# Patient Record
Sex: Female | Born: 1970 | Race: White | Hispanic: No | Marital: Married | Smoking: Never smoker
Health system: Southern US, Community
[De-identification: ages and names within clinical notes are randomized; demographics above are authoritative.]

## PROBLEM LIST (undated history)

## (undated) DIAGNOSIS — R112 Nausea with vomiting, unspecified: Secondary | ICD-10-CM

## (undated) DIAGNOSIS — J45909 Unspecified asthma, uncomplicated: Secondary | ICD-10-CM

## (undated) DIAGNOSIS — M1990 Unspecified osteoarthritis, unspecified site: Secondary | ICD-10-CM

## (undated) DIAGNOSIS — Z9889 Other specified postprocedural states: Secondary | ICD-10-CM

## (undated) DIAGNOSIS — G8929 Other chronic pain: Secondary | ICD-10-CM

## (undated) HISTORY — PX: APPENDECTOMY: SHX54

---
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
## 1998-07-27 ENCOUNTER — Inpatient Hospital Stay (HOSPITAL_COMMUNITY): Admission: AD | Admit: 1998-07-27 | Discharge: 1998-07-30 | Payer: Self-pay | Admitting: Obstetrics and Gynecology

## 1998-07-29 ENCOUNTER — Encounter: Payer: Self-pay | Admitting: Obstetrics and Gynecology

## 1999-08-18 ENCOUNTER — Other Ambulatory Visit: Admission: RE | Admit: 1999-08-18 | Discharge: 1999-08-18 | Payer: Self-pay | Admitting: Obstetrics and Gynecology

## 2001-01-23 ENCOUNTER — Other Ambulatory Visit: Admission: RE | Admit: 2001-01-23 | Discharge: 2001-01-23 | Payer: Self-pay | Admitting: Obstetrics and Gynecology

## 2001-09-29 ENCOUNTER — Ambulatory Visit (HOSPITAL_COMMUNITY): Admission: RE | Admit: 2001-09-29 | Discharge: 2001-09-29 | Payer: Self-pay | Admitting: Obstetrics & Gynecology

## 2001-09-29 ENCOUNTER — Encounter: Payer: Self-pay | Admitting: Obstetrics & Gynecology

## 2001-10-09 ENCOUNTER — Inpatient Hospital Stay (HOSPITAL_COMMUNITY): Admission: AD | Admit: 2001-10-09 | Discharge: 2001-10-12 | Payer: Self-pay | Admitting: Obstetrics & Gynecology

## 2002-07-02 ENCOUNTER — Encounter: Payer: Self-pay | Admitting: Obstetrics and Gynecology

## 2002-07-02 ENCOUNTER — Ambulatory Visit (HOSPITAL_COMMUNITY): Admission: RE | Admit: 2002-07-02 | Discharge: 2002-07-02 | Payer: Self-pay | Admitting: Obstetrics and Gynecology

## 2003-03-04 ENCOUNTER — Other Ambulatory Visit: Admission: RE | Admit: 2003-03-04 | Discharge: 2003-03-04 | Payer: Self-pay | Admitting: Obstetrics & Gynecology

## 2003-09-15 ENCOUNTER — Ambulatory Visit (HOSPITAL_COMMUNITY): Admission: RE | Admit: 2003-09-15 | Discharge: 2003-09-15 | Payer: Self-pay | Admitting: Obstetrics and Gynecology

## 2005-04-18 ENCOUNTER — Other Ambulatory Visit: Admission: RE | Admit: 2005-04-18 | Discharge: 2005-04-18 | Payer: Self-pay | Admitting: Obstetrics & Gynecology

## 2005-11-20 ENCOUNTER — Ambulatory Visit (HOSPITAL_COMMUNITY): Admission: RE | Admit: 2005-11-20 | Discharge: 2005-11-20 | Payer: Self-pay | Admitting: Obstetrics & Gynecology

## 2010-04-24 ENCOUNTER — Encounter: Admission: RE | Admit: 2010-04-24 | Discharge: 2010-04-24 | Payer: Self-pay | Admitting: Obstetrics & Gynecology

## 2010-10-27 NOTE — Discharge Summary (Signed)
[facility]  Patient:    Alyssa Hester, Alyssa Hester Visit Number: [id number] MRN: [id number]          Service Type: OBS Location: 910A 9130 01 Attending Physician:  Mickle Mallory Dictated by:   Leilani Able, P.A. Admit Date:  10/09/2001 Discharge Date: 10/12/2001                             Discharge Summary  FINAL DIAGNOSES:              1. Intrauterine pregnancy at 38+ weeks                                  gestation.                               2. Active macrosomia.  PROCEDURE:                    Primary low transverse cesarean section.  SURGEON:                      Gerrit Friends. Aldona Bar, M.D.  COMPLICATIONS:                None.  HOSPITAL COURSE:              This 40 year old, G3, P1 is being taken to the operating room on Oct 09, 2001 for suspected macrosomia.  The patient had an ultrasound performed at the [facility] two days ago which was consistent with a baby of at least 9 pounds.  This is her second delivery. Her first was born around 7 pounds.  At this point, the patients cervix was closed and vertex was totally unengaged.  Estimated fetal weight is between 9 and 10 pounds.  The patient was taken to the operating room by Dr. Annamaria Helling, where a primary low transverse cesarean section was performed with the delivery of a 9 pound 12 ounce female infant with Apgars of 8 and 9.  Delivery went without complication.  The patients postoperative course was benign without significant fevers.  The patient had some postoperative anemia and was started on iron in the hospital.  She was felt ready for discharge on postoperative day three.  The patient was sent home on a regular diet and told to decrease activities.  She was told to continue prenatal vitamins and FESO4.  She was given a prescription for Percocet and ibuprofen.  She was told to follow up in the office in four weeks.  The patient was also to call with any increased pain or  bleeding. Dictated by:   Leilani Able, P.A. Attending Physician:  Mickle Mallory DD:  11/10/01 TD:  11/12/01 Job: [id number]

## 2010-10-27 NOTE — Op Note (Signed)
[facility]  Patient:    Alyssa Hester, Alyssa Hester Visit Number: [id number] MRN: [id number]          Service Type: OBS Location: [facility] Attending Physician:  Mickle Mallory Dictated by:   Gerrit Friends. Aldona Bar, M.D. Proc. Date: 10/09/01                             Operative Report  PREOPERATIVE DIAGNOSES:       A 38+ week intrauterine pregnancy, suspected macrosomia.  POSTOPERATIVE DIAGNOSES:      A 38+ week intrauterine pregnancy, suspected macrosomia, delivery of 9 pound 12 ounce female infant, Apgars 8/9.  PROCEDURE:                    Primary low transverse cesarean section.  SURGEON:                      Gerrit Friends. Aldona Bar, M.D.  ANESTHESIA:                   Spinal.  HISTORY:                      This 40 year old gravida 3, para 1 is being taken to the operating room with a diagnosis of suspected macrosomia having had an ultrasound at [facility] 10 days ago which was consistent with a baby that was at least 9 pounds.  She had her first delivery several years ago and delivered a 7 pound baby.  At the time of admission with this pregnancy her cervix was closed.  Vertex was totally unengaged.  Estimated fetal weight was at least 9-10 pounds.  She is taken to the operating room now for primary low transverse cesarean section with a diagnosis of suspected macrosomia.  PROCEDURE:                    Patient was taken to the operating room where a spinal anesthetic was carried out without difficulty and thereafter she was prepped and draped with a Foley catheter placed in the usual fashion for cesarean section.  At this time after she was adequately draped and good anesthetic levels were documented, procedure was begun.  A Pfannenstiel incision was made and with minimal difficulty dissected down sharply to and through the fascia in a low transverse fashion.  Hemostasis was created at each layer.  Subfascial space was created inferiorly and superiorly.   Muscles separated in the midline.  Peritoneum identified and entered appropriately with care taken to avoid the bowel superiorly and the bladder inferiorly.  At this time the vesicouterine peritoneum was incised in a low transverse fashion and pushed off the lower uterine segment with ease.  Sharp incision into the uterus in a low transverse fashion was then carried out with Metzenbaum scissors and extended with the fingers.  Amniotomy was produced with production of clear fluid and thereafter with the aid of the vacuum extractor delivery of a viable female infant which cried spotaneously at once was carried out.  There was some difficulty with delivery of the shoulders.  At this time after the cord was clamped and cut the baby was passed off to the awaiting team.  Subsequent Apgars were assigned at 8 and 9 and subsequent weight was found to be 9 pounds 12 ounces.  After the cord bloods were collected the placenta was delivered intact.  The uterus  was then exteriorized, manually inspected, and rendered free of any remaining products of conception.  Closure of the uterus was then carried out using a single layer of #1 Vicryl in a running locked fashion and this was reinforced with several figure-of-eight #1 Vicryl sutures for additional hemostasis.  At this time with good hemostasis noted and with tubes and ovaries noted to be normal, the uterus contracted well and no foreign bodies to be known to be remaining in the abdominal cavity, the uterus was replaced in the abdomen and the abdomen lavaged of all free blood and clot.  Closure of the abdomen was carried out at this time in layers.  The abdominoperitoneum was closed with 0 Vicryl in a running fashion and the muscles assured with same.  Assured of good subfascial hemostasis, the fascia was then reapproximated with 0 Vicryl ______ to the midline bilaterally.  Subcutaneous tissue was then rendered hemostatic and staples were then used to  close the skin.  Sterile pressure dressing was then applied.  The patient at this time was transported to the recovery room in satisfactory condition having tolerated procedure well.  Estimated blood loss 500 cc.  All counts correct x2.  On arrival in the recovery room mother was stable and baby was likewise doing well in the nursery.  In summary, this patient presented at near term with a baby that was strongly suspected to be in excess of 9 pounds and because of the patients small frame and the fact that the vertex was floating and an ultrasound was consistent with a baby that was at least 9 pounds 10 days ago, decision was made to proceed with primary low transverse cesarean section because of suspected macrosomia.  A baby weighing 9 pounds 12 ounces was delivered with good Apgars.  At the conclusion of the procedure both mother and baby were doing well in their respective recovery areas. Dictated by:   Gerrit Friends. Aldona Bar, M.D. Attending Physician:  Mickle Mallory DD:  10/09/01 TD:  10/10/01 Job: [id number]

## 2010-12-22 ENCOUNTER — Ambulatory Visit (HOSPITAL_COMMUNITY)
Admission: RE | Admit: 2010-12-22 | Discharge: 2010-12-22 | Disposition: A | Discharge status: Home / Self Care | Payer: PRIVATE HEALTH INSURANCE | Source: Ambulatory Visit | Attending: Obstetrics and Gynecology | Admitting: Obstetrics and Gynecology

## 2010-12-22 ENCOUNTER — Other Ambulatory Visit (HOSPITAL_COMMUNITY): Payer: Self-pay | Admitting: Obstetrics and Gynecology

## 2010-12-22 DIAGNOSIS — Z111 Encounter for screening for respiratory tuberculosis: Secondary | ICD-10-CM

## 2011-09-05 ENCOUNTER — Other Ambulatory Visit: Payer: Self-pay | Admitting: Obstetrics & Gynecology

## 2011-09-05 DIAGNOSIS — Z1231 Encounter for screening mammogram for malignant neoplasm of breast: Secondary | ICD-10-CM

## 2011-09-19 ENCOUNTER — Ambulatory Visit
Admission: RE | Admit: 2011-09-19 | Discharge: 2011-09-19 | Disposition: A | Discharge status: Home / Self Care | Payer: PRIVATE HEALTH INSURANCE | Source: Ambulatory Visit | Attending: Obstetrics & Gynecology | Admitting: Obstetrics & Gynecology

## 2011-09-19 DIAGNOSIS — Z1231 Encounter for screening mammogram for malignant neoplasm of breast: Secondary | ICD-10-CM

## 2014-03-15 ENCOUNTER — Other Ambulatory Visit: Payer: Self-pay

## 2014-03-15 DIAGNOSIS — Z1231 Encounter for screening mammogram for malignant neoplasm of breast: Secondary | ICD-10-CM

## 2014-03-24 ENCOUNTER — Encounter (INDEPENDENT_AMBULATORY_CARE_PROVIDER_SITE_OTHER): Payer: Self-pay

## 2014-03-24 ENCOUNTER — Ambulatory Visit
Admission: RE | Admit: 2014-03-24 | Discharge: 2014-03-24 | Disposition: A | Discharge status: Home / Self Care | Payer: BC Managed Care – PPO | Source: Ambulatory Visit

## 2014-03-24 DIAGNOSIS — Z1231 Encounter for screening mammogram for malignant neoplasm of breast: Secondary | ICD-10-CM

## 2015-08-05 ENCOUNTER — Other Ambulatory Visit: Payer: Self-pay

## 2015-08-05 DIAGNOSIS — Z1231 Encounter for screening mammogram for malignant neoplasm of breast: Secondary | ICD-10-CM

## 2015-08-17 ENCOUNTER — Ambulatory Visit
Admission: RE | Admit: 2015-08-17 | Discharge: 2015-08-17 | Disposition: A | Discharge status: Home / Self Care | Payer: Managed Care, Other (non HMO) | Source: Ambulatory Visit

## 2015-08-17 DIAGNOSIS — Z1231 Encounter for screening mammogram for malignant neoplasm of breast: Secondary | ICD-10-CM

## 2018-01-31 ENCOUNTER — Other Ambulatory Visit: Payer: Self-pay | Admitting: Obstetrics & Gynecology

## 2018-01-31 DIAGNOSIS — Z1231 Encounter for screening mammogram for malignant neoplasm of breast: Secondary | ICD-10-CM

## 2018-02-26 ENCOUNTER — Ambulatory Visit
Admission: RE | Admit: 2018-02-26 | Discharge: 2018-02-26 | Disposition: A | Discharge status: Home / Self Care | Payer: Managed Care, Other (non HMO) | Source: Ambulatory Visit | Attending: Obstetrics & Gynecology | Admitting: Obstetrics & Gynecology

## 2018-02-26 DIAGNOSIS — Z1231 Encounter for screening mammogram for malignant neoplasm of breast: Secondary | ICD-10-CM

## 2019-02-27 ENCOUNTER — Other Ambulatory Visit: Payer: Self-pay | Admitting: *Deleted

## 2019-02-27 DIAGNOSIS — Z20822 Contact with and (suspected) exposure to covid-19: Secondary | ICD-10-CM

## 2019-02-28 LAB — NOVEL CORONAVIRUS, NAA: SARS-CoV-2, NAA: DETECTED — AB

## 2019-07-12 DIAGNOSIS — W109XXA Fall (on) (from) unspecified stairs and steps, initial encounter: Secondary | ICD-10-CM | POA: Diagnosis not present

## 2019-07-12 DIAGNOSIS — Z882 Allergy status to sulfonamides status: Secondary | ICD-10-CM | POA: Diagnosis not present

## 2019-07-12 DIAGNOSIS — J45909 Unspecified asthma, uncomplicated: Secondary | ICD-10-CM | POA: Diagnosis not present

## 2019-07-12 DIAGNOSIS — S20221A Contusion of right back wall of thorax, initial encounter: Secondary | ICD-10-CM | POA: Diagnosis not present

## 2019-07-12 DIAGNOSIS — S20229A Contusion of unspecified back wall of thorax, initial encounter: Secondary | ICD-10-CM | POA: Diagnosis not present

## 2019-07-12 DIAGNOSIS — S299XXA Unspecified injury of thorax, initial encounter: Secondary | ICD-10-CM | POA: Diagnosis not present

## 2019-07-12 DIAGNOSIS — M546 Pain in thoracic spine: Secondary | ICD-10-CM | POA: Diagnosis not present

## 2019-07-12 DIAGNOSIS — Z6833 Body mass index (BMI) 33.0-33.9, adult: Secondary | ICD-10-CM | POA: Diagnosis not present

## 2019-10-16 ENCOUNTER — Other Ambulatory Visit: Payer: Self-pay | Admitting: Obstetrics & Gynecology

## 2019-10-16 DIAGNOSIS — Z1231 Encounter for screening mammogram for malignant neoplasm of breast: Secondary | ICD-10-CM

## 2019-10-20 ENCOUNTER — Ambulatory Visit
Admission: RE | Admit: 2019-10-20 | Discharge: 2019-10-20 | Disposition: A | Discharge status: Home / Self Care | Payer: BC Managed Care – PPO | Source: Ambulatory Visit | Attending: Obstetrics & Gynecology | Admitting: Obstetrics & Gynecology

## 2019-10-20 ENCOUNTER — Other Ambulatory Visit: Payer: Self-pay

## 2019-10-20 DIAGNOSIS — Z1231 Encounter for screening mammogram for malignant neoplasm of breast: Secondary | ICD-10-CM

## 2019-10-21 ENCOUNTER — Other Ambulatory Visit: Payer: Self-pay | Admitting: Obstetrics & Gynecology

## 2019-10-21 DIAGNOSIS — R928 Other abnormal and inconclusive findings on diagnostic imaging of breast: Secondary | ICD-10-CM

## 2019-10-27 ENCOUNTER — Other Ambulatory Visit: Payer: Self-pay

## 2019-10-27 ENCOUNTER — Ambulatory Visit
Admission: RE | Admit: 2019-10-27 | Discharge: 2019-10-27 | Disposition: A | Discharge status: Home / Self Care | Payer: BC Managed Care – PPO | Source: Ambulatory Visit | Attending: Obstetrics & Gynecology | Admitting: Obstetrics & Gynecology

## 2019-10-27 DIAGNOSIS — R928 Other abnormal and inconclusive findings on diagnostic imaging of breast: Secondary | ICD-10-CM

## 2019-11-17 DIAGNOSIS — M9903 Segmental and somatic dysfunction of lumbar region: Secondary | ICD-10-CM | POA: Diagnosis not present

## 2019-11-17 DIAGNOSIS — M9902 Segmental and somatic dysfunction of thoracic region: Secondary | ICD-10-CM | POA: Diagnosis not present

## 2019-11-17 DIAGNOSIS — M5386 Other specified dorsopathies, lumbar region: Secondary | ICD-10-CM | POA: Diagnosis not present

## 2019-11-17 DIAGNOSIS — M9905 Segmental and somatic dysfunction of pelvic region: Secondary | ICD-10-CM | POA: Diagnosis not present

## 2019-11-18 DIAGNOSIS — M9903 Segmental and somatic dysfunction of lumbar region: Secondary | ICD-10-CM | POA: Diagnosis not present

## 2019-11-18 DIAGNOSIS — M9902 Segmental and somatic dysfunction of thoracic region: Secondary | ICD-10-CM | POA: Diagnosis not present

## 2019-11-18 DIAGNOSIS — M9905 Segmental and somatic dysfunction of pelvic region: Secondary | ICD-10-CM | POA: Diagnosis not present

## 2019-11-18 DIAGNOSIS — M5386 Other specified dorsopathies, lumbar region: Secondary | ICD-10-CM | POA: Diagnosis not present

## 2019-11-24 DIAGNOSIS — M9903 Segmental and somatic dysfunction of lumbar region: Secondary | ICD-10-CM | POA: Diagnosis not present

## 2019-11-24 DIAGNOSIS — M9902 Segmental and somatic dysfunction of thoracic region: Secondary | ICD-10-CM | POA: Diagnosis not present

## 2019-11-24 DIAGNOSIS — M9905 Segmental and somatic dysfunction of pelvic region: Secondary | ICD-10-CM | POA: Diagnosis not present

## 2019-11-24 DIAGNOSIS — M5386 Other specified dorsopathies, lumbar region: Secondary | ICD-10-CM | POA: Diagnosis not present

## 2019-11-25 DIAGNOSIS — M5386 Other specified dorsopathies, lumbar region: Secondary | ICD-10-CM | POA: Diagnosis not present

## 2019-11-25 DIAGNOSIS — M9903 Segmental and somatic dysfunction of lumbar region: Secondary | ICD-10-CM | POA: Diagnosis not present

## 2019-11-25 DIAGNOSIS — M9902 Segmental and somatic dysfunction of thoracic region: Secondary | ICD-10-CM | POA: Diagnosis not present

## 2019-11-25 DIAGNOSIS — M9905 Segmental and somatic dysfunction of pelvic region: Secondary | ICD-10-CM | POA: Diagnosis not present

## 2019-12-01 DIAGNOSIS — M9905 Segmental and somatic dysfunction of pelvic region: Secondary | ICD-10-CM | POA: Diagnosis not present

## 2019-12-01 DIAGNOSIS — M9902 Segmental and somatic dysfunction of thoracic region: Secondary | ICD-10-CM | POA: Diagnosis not present

## 2019-12-01 DIAGNOSIS — M5386 Other specified dorsopathies, lumbar region: Secondary | ICD-10-CM | POA: Diagnosis not present

## 2019-12-01 DIAGNOSIS — M9903 Segmental and somatic dysfunction of lumbar region: Secondary | ICD-10-CM | POA: Diagnosis not present

## 2019-12-02 DIAGNOSIS — M9905 Segmental and somatic dysfunction of pelvic region: Secondary | ICD-10-CM | POA: Diagnosis not present

## 2019-12-02 DIAGNOSIS — M5386 Other specified dorsopathies, lumbar region: Secondary | ICD-10-CM | POA: Diagnosis not present

## 2019-12-02 DIAGNOSIS — M9903 Segmental and somatic dysfunction of lumbar region: Secondary | ICD-10-CM | POA: Diagnosis not present

## 2019-12-02 DIAGNOSIS — M9902 Segmental and somatic dysfunction of thoracic region: Secondary | ICD-10-CM | POA: Diagnosis not present

## 2019-12-08 DIAGNOSIS — M5386 Other specified dorsopathies, lumbar region: Secondary | ICD-10-CM | POA: Diagnosis not present

## 2019-12-08 DIAGNOSIS — M9903 Segmental and somatic dysfunction of lumbar region: Secondary | ICD-10-CM | POA: Diagnosis not present

## 2019-12-08 DIAGNOSIS — M9905 Segmental and somatic dysfunction of pelvic region: Secondary | ICD-10-CM | POA: Diagnosis not present

## 2019-12-08 DIAGNOSIS — M9902 Segmental and somatic dysfunction of thoracic region: Secondary | ICD-10-CM | POA: Diagnosis not present

## 2019-12-09 DIAGNOSIS — M9903 Segmental and somatic dysfunction of lumbar region: Secondary | ICD-10-CM | POA: Diagnosis not present

## 2019-12-09 DIAGNOSIS — M9902 Segmental and somatic dysfunction of thoracic region: Secondary | ICD-10-CM | POA: Diagnosis not present

## 2019-12-09 DIAGNOSIS — M5386 Other specified dorsopathies, lumbar region: Secondary | ICD-10-CM | POA: Diagnosis not present

## 2019-12-09 DIAGNOSIS — M9905 Segmental and somatic dysfunction of pelvic region: Secondary | ICD-10-CM | POA: Diagnosis not present

## 2019-12-15 DIAGNOSIS — M5386 Other specified dorsopathies, lumbar region: Secondary | ICD-10-CM | POA: Diagnosis not present

## 2019-12-15 DIAGNOSIS — M9903 Segmental and somatic dysfunction of lumbar region: Secondary | ICD-10-CM | POA: Diagnosis not present

## 2019-12-15 DIAGNOSIS — M9902 Segmental and somatic dysfunction of thoracic region: Secondary | ICD-10-CM | POA: Diagnosis not present

## 2019-12-15 DIAGNOSIS — M9905 Segmental and somatic dysfunction of pelvic region: Secondary | ICD-10-CM | POA: Diagnosis not present

## 2019-12-16 DIAGNOSIS — M9903 Segmental and somatic dysfunction of lumbar region: Secondary | ICD-10-CM | POA: Diagnosis not present

## 2019-12-16 DIAGNOSIS — M5386 Other specified dorsopathies, lumbar region: Secondary | ICD-10-CM | POA: Diagnosis not present

## 2019-12-16 DIAGNOSIS — M9902 Segmental and somatic dysfunction of thoracic region: Secondary | ICD-10-CM | POA: Diagnosis not present

## 2019-12-16 DIAGNOSIS — M9905 Segmental and somatic dysfunction of pelvic region: Secondary | ICD-10-CM | POA: Diagnosis not present

## 2019-12-29 DIAGNOSIS — M5386 Other specified dorsopathies, lumbar region: Secondary | ICD-10-CM | POA: Diagnosis not present

## 2019-12-29 DIAGNOSIS — M9903 Segmental and somatic dysfunction of lumbar region: Secondary | ICD-10-CM | POA: Diagnosis not present

## 2019-12-29 DIAGNOSIS — M9905 Segmental and somatic dysfunction of pelvic region: Secondary | ICD-10-CM | POA: Diagnosis not present

## 2019-12-29 DIAGNOSIS — M9902 Segmental and somatic dysfunction of thoracic region: Secondary | ICD-10-CM | POA: Diagnosis not present

## 2020-01-05 DIAGNOSIS — M9903 Segmental and somatic dysfunction of lumbar region: Secondary | ICD-10-CM | POA: Diagnosis not present

## 2020-01-05 DIAGNOSIS — M9902 Segmental and somatic dysfunction of thoracic region: Secondary | ICD-10-CM | POA: Diagnosis not present

## 2020-01-05 DIAGNOSIS — M5386 Other specified dorsopathies, lumbar region: Secondary | ICD-10-CM | POA: Diagnosis not present

## 2020-01-05 DIAGNOSIS — M9905 Segmental and somatic dysfunction of pelvic region: Secondary | ICD-10-CM | POA: Diagnosis not present

## 2020-01-12 DIAGNOSIS — M9905 Segmental and somatic dysfunction of pelvic region: Secondary | ICD-10-CM | POA: Diagnosis not present

## 2020-01-12 DIAGNOSIS — M5386 Other specified dorsopathies, lumbar region: Secondary | ICD-10-CM | POA: Diagnosis not present

## 2020-01-12 DIAGNOSIS — M9903 Segmental and somatic dysfunction of lumbar region: Secondary | ICD-10-CM | POA: Diagnosis not present

## 2020-01-12 DIAGNOSIS — M9902 Segmental and somatic dysfunction of thoracic region: Secondary | ICD-10-CM | POA: Diagnosis not present

## 2020-01-19 DIAGNOSIS — M9903 Segmental and somatic dysfunction of lumbar region: Secondary | ICD-10-CM | POA: Diagnosis not present

## 2020-01-19 DIAGNOSIS — M9902 Segmental and somatic dysfunction of thoracic region: Secondary | ICD-10-CM | POA: Diagnosis not present

## 2020-01-19 DIAGNOSIS — M5386 Other specified dorsopathies, lumbar region: Secondary | ICD-10-CM | POA: Diagnosis not present

## 2020-01-19 DIAGNOSIS — M9905 Segmental and somatic dysfunction of pelvic region: Secondary | ICD-10-CM | POA: Diagnosis not present

## 2020-01-27 DIAGNOSIS — M9903 Segmental and somatic dysfunction of lumbar region: Secondary | ICD-10-CM | POA: Diagnosis not present

## 2020-01-27 DIAGNOSIS — M5386 Other specified dorsopathies, lumbar region: Secondary | ICD-10-CM | POA: Diagnosis not present

## 2020-01-27 DIAGNOSIS — M9905 Segmental and somatic dysfunction of pelvic region: Secondary | ICD-10-CM | POA: Diagnosis not present

## 2020-01-27 DIAGNOSIS — M9902 Segmental and somatic dysfunction of thoracic region: Secondary | ICD-10-CM | POA: Diagnosis not present

## 2020-02-03 DIAGNOSIS — M9902 Segmental and somatic dysfunction of thoracic region: Secondary | ICD-10-CM | POA: Diagnosis not present

## 2020-02-03 DIAGNOSIS — M5386 Other specified dorsopathies, lumbar region: Secondary | ICD-10-CM | POA: Diagnosis not present

## 2020-02-03 DIAGNOSIS — M9905 Segmental and somatic dysfunction of pelvic region: Secondary | ICD-10-CM | POA: Diagnosis not present

## 2020-02-03 DIAGNOSIS — M9903 Segmental and somatic dysfunction of lumbar region: Secondary | ICD-10-CM | POA: Diagnosis not present

## 2020-02-09 DIAGNOSIS — M9902 Segmental and somatic dysfunction of thoracic region: Secondary | ICD-10-CM | POA: Diagnosis not present

## 2020-02-09 DIAGNOSIS — M9905 Segmental and somatic dysfunction of pelvic region: Secondary | ICD-10-CM | POA: Diagnosis not present

## 2020-02-09 DIAGNOSIS — M5386 Other specified dorsopathies, lumbar region: Secondary | ICD-10-CM | POA: Diagnosis not present

## 2020-02-09 DIAGNOSIS — M9903 Segmental and somatic dysfunction of lumbar region: Secondary | ICD-10-CM | POA: Diagnosis not present

## 2020-02-22 DIAGNOSIS — Z124 Encounter for screening for malignant neoplasm of cervix: Secondary | ICD-10-CM | POA: Diagnosis not present

## 2020-02-22 DIAGNOSIS — Z0000 Encounter for general adult medical examination without abnormal findings: Secondary | ICD-10-CM | POA: Diagnosis not present

## 2020-02-22 DIAGNOSIS — Z6833 Body mass index (BMI) 33.0-33.9, adult: Secondary | ICD-10-CM | POA: Diagnosis not present

## 2020-02-22 DIAGNOSIS — Z01419 Encounter for gynecological examination (general) (routine) without abnormal findings: Secondary | ICD-10-CM | POA: Diagnosis not present

## 2020-02-23 DIAGNOSIS — M9905 Segmental and somatic dysfunction of pelvic region: Secondary | ICD-10-CM | POA: Diagnosis not present

## 2020-02-23 DIAGNOSIS — M9903 Segmental and somatic dysfunction of lumbar region: Secondary | ICD-10-CM | POA: Diagnosis not present

## 2020-02-23 DIAGNOSIS — M5386 Other specified dorsopathies, lumbar region: Secondary | ICD-10-CM | POA: Diagnosis not present

## 2020-02-23 DIAGNOSIS — M9902 Segmental and somatic dysfunction of thoracic region: Secondary | ICD-10-CM | POA: Diagnosis not present

## 2020-03-02 DIAGNOSIS — M9903 Segmental and somatic dysfunction of lumbar region: Secondary | ICD-10-CM | POA: Diagnosis not present

## 2020-03-02 DIAGNOSIS — M9902 Segmental and somatic dysfunction of thoracic region: Secondary | ICD-10-CM | POA: Diagnosis not present

## 2020-03-02 DIAGNOSIS — M9905 Segmental and somatic dysfunction of pelvic region: Secondary | ICD-10-CM | POA: Diagnosis not present

## 2020-03-02 DIAGNOSIS — M5386 Other specified dorsopathies, lumbar region: Secondary | ICD-10-CM | POA: Diagnosis not present

## 2020-03-08 DIAGNOSIS — M9903 Segmental and somatic dysfunction of lumbar region: Secondary | ICD-10-CM | POA: Diagnosis not present

## 2020-03-08 DIAGNOSIS — M5386 Other specified dorsopathies, lumbar region: Secondary | ICD-10-CM | POA: Diagnosis not present

## 2020-03-08 DIAGNOSIS — M9902 Segmental and somatic dysfunction of thoracic region: Secondary | ICD-10-CM | POA: Diagnosis not present

## 2020-03-08 DIAGNOSIS — M9905 Segmental and somatic dysfunction of pelvic region: Secondary | ICD-10-CM | POA: Diagnosis not present

## 2020-03-15 DIAGNOSIS — M9902 Segmental and somatic dysfunction of thoracic region: Secondary | ICD-10-CM | POA: Diagnosis not present

## 2020-03-15 DIAGNOSIS — M9903 Segmental and somatic dysfunction of lumbar region: Secondary | ICD-10-CM | POA: Diagnosis not present

## 2020-03-15 DIAGNOSIS — M5386 Other specified dorsopathies, lumbar region: Secondary | ICD-10-CM | POA: Diagnosis not present

## 2020-03-15 DIAGNOSIS — M9905 Segmental and somatic dysfunction of pelvic region: Secondary | ICD-10-CM | POA: Diagnosis not present

## 2020-03-29 DIAGNOSIS — M545 Low back pain, unspecified: Secondary | ICD-10-CM | POA: Diagnosis not present

## 2020-03-29 DIAGNOSIS — M9902 Segmental and somatic dysfunction of thoracic region: Secondary | ICD-10-CM | POA: Diagnosis not present

## 2020-03-29 DIAGNOSIS — M5386 Other specified dorsopathies, lumbar region: Secondary | ICD-10-CM | POA: Diagnosis not present

## 2020-03-29 DIAGNOSIS — M9903 Segmental and somatic dysfunction of lumbar region: Secondary | ICD-10-CM | POA: Diagnosis not present

## 2020-03-29 DIAGNOSIS — M9905 Segmental and somatic dysfunction of pelvic region: Secondary | ICD-10-CM | POA: Diagnosis not present

## 2020-04-07 DIAGNOSIS — M545 Low back pain, unspecified: Secondary | ICD-10-CM | POA: Diagnosis not present

## 2020-04-07 DIAGNOSIS — M25552 Pain in left hip: Secondary | ICD-10-CM | POA: Diagnosis not present

## 2020-04-07 DIAGNOSIS — M546 Pain in thoracic spine: Secondary | ICD-10-CM | POA: Diagnosis not present

## 2020-04-09 DIAGNOSIS — Z7189 Other specified counseling: Secondary | ICD-10-CM | POA: Diagnosis not present

## 2020-04-09 DIAGNOSIS — Z23 Encounter for immunization: Secondary | ICD-10-CM | POA: Diagnosis not present

## 2020-04-26 DIAGNOSIS — M546 Pain in thoracic spine: Secondary | ICD-10-CM | POA: Diagnosis not present

## 2020-04-26 DIAGNOSIS — M7062 Trochanteric bursitis, left hip: Secondary | ICD-10-CM | POA: Diagnosis not present

## 2020-05-21 DIAGNOSIS — M546 Pain in thoracic spine: Secondary | ICD-10-CM | POA: Diagnosis not present

## 2020-05-25 DIAGNOSIS — M25552 Pain in left hip: Secondary | ICD-10-CM | POA: Diagnosis not present

## 2020-07-05 DIAGNOSIS — M546 Pain in thoracic spine: Secondary | ICD-10-CM | POA: Diagnosis not present

## 2020-07-14 DIAGNOSIS — D3611 Benign neoplasm of peripheral nerves and autonomic nervous system of face, head, and neck: Secondary | ICD-10-CM | POA: Diagnosis not present

## 2020-07-14 DIAGNOSIS — M5414 Radiculopathy, thoracic region: Secondary | ICD-10-CM | POA: Diagnosis not present

## 2020-07-29 ENCOUNTER — Other Ambulatory Visit: Payer: Self-pay | Admitting: Neurosurgery

## 2020-07-29 ENCOUNTER — Other Ambulatory Visit (HOSPITAL_COMMUNITY): Payer: Self-pay | Admitting: Neurosurgery

## 2020-07-29 DIAGNOSIS — D3611 Benign neoplasm of peripheral nerves and autonomic nervous system of face, head, and neck: Secondary | ICD-10-CM

## 2020-08-09 ENCOUNTER — Ambulatory Visit (HOSPITAL_COMMUNITY): Payer: BC Managed Care – PPO

## 2020-08-09 ENCOUNTER — Encounter (HOSPITAL_COMMUNITY): Payer: Self-pay

## 2020-08-17 ENCOUNTER — Ambulatory Visit (HOSPITAL_COMMUNITY): Payer: BC Managed Care – PPO

## 2020-08-19 ENCOUNTER — Ambulatory Visit (HOSPITAL_COMMUNITY): Payer: BC Managed Care – PPO

## 2020-08-26 ENCOUNTER — Ambulatory Visit (HOSPITAL_COMMUNITY)
Admission: RE | Admit: 2020-08-26 | Discharge: 2020-08-26 | Disposition: A | Discharge status: Home / Self Care | Payer: BC Managed Care – PPO | Source: Ambulatory Visit | Attending: Neurosurgery | Admitting: Neurosurgery

## 2020-08-26 ENCOUNTER — Other Ambulatory Visit: Payer: Self-pay

## 2020-08-26 DIAGNOSIS — D3611 Benign neoplasm of peripheral nerves and autonomic nervous system of face, head, and neck: Secondary | ICD-10-CM | POA: Insufficient documentation

## 2020-08-26 LAB — POCT I-STAT CREATININE: Creatinine, Ser: 0.6 mg/dL (ref 0.44–1.00)

## 2020-08-26 MED ORDER — IOHEXOL 350 MG/ML SOLN
75.0000 mL | Freq: Once | INTRAVENOUS | Status: AC | PRN
  Administered 2020-08-26: 75 mL via INTRAVENOUS

## 2020-09-08 DIAGNOSIS — D3611 Benign neoplasm of peripheral nerves and autonomic nervous system of face, head, and neck: Secondary | ICD-10-CM | POA: Diagnosis not present

## 2020-09-08 DIAGNOSIS — Z6832 Body mass index (BMI) 32.0-32.9, adult: Secondary | ICD-10-CM | POA: Diagnosis not present

## 2020-09-08 DIAGNOSIS — R030 Elevated blood-pressure reading, without diagnosis of hypertension: Secondary | ICD-10-CM | POA: Diagnosis not present

## 2020-09-15 DIAGNOSIS — M5414 Radiculopathy, thoracic region: Secondary | ICD-10-CM | POA: Diagnosis not present

## 2020-09-19 ENCOUNTER — Other Ambulatory Visit: Payer: Self-pay | Admitting: Neurosurgery

## 2020-09-28 DIAGNOSIS — M25552 Pain in left hip: Secondary | ICD-10-CM | POA: Diagnosis not present

## 2020-09-28 DIAGNOSIS — M25551 Pain in right hip: Secondary | ICD-10-CM | POA: Diagnosis not present

## 2020-10-10 ENCOUNTER — Other Ambulatory Visit: Payer: Self-pay

## 2020-10-10 ENCOUNTER — Emergency Department (HOSPITAL_BASED_OUTPATIENT_CLINIC_OR_DEPARTMENT_OTHER)
Admission: EM | Admit: 2020-10-10 | Discharge: 2020-10-10 | Disposition: A | Discharge status: Home / Self Care | Payer: BC Managed Care – PPO | Attending: Emergency Medicine | Admitting: Emergency Medicine

## 2020-10-10 ENCOUNTER — Emergency Department (HOSPITAL_BASED_OUTPATIENT_CLINIC_OR_DEPARTMENT_OTHER): Payer: BC Managed Care – PPO

## 2020-10-10 ENCOUNTER — Encounter (HOSPITAL_BASED_OUTPATIENT_CLINIC_OR_DEPARTMENT_OTHER): Payer: Self-pay | Admitting: *Deleted

## 2020-10-10 DIAGNOSIS — R002 Palpitations: Secondary | ICD-10-CM | POA: Diagnosis not present

## 2020-10-10 DIAGNOSIS — R1011 Right upper quadrant pain: Secondary | ICD-10-CM | POA: Diagnosis not present

## 2020-10-10 DIAGNOSIS — R6889 Other general symptoms and signs: Secondary | ICD-10-CM | POA: Diagnosis not present

## 2020-10-10 DIAGNOSIS — R000 Tachycardia, unspecified: Secondary | ICD-10-CM | POA: Diagnosis not present

## 2020-10-10 DIAGNOSIS — R1032 Left lower quadrant pain: Secondary | ICD-10-CM | POA: Diagnosis not present

## 2020-10-10 DIAGNOSIS — R42 Dizziness and giddiness: Secondary | ICD-10-CM | POA: Diagnosis not present

## 2020-10-10 DIAGNOSIS — N200 Calculus of kidney: Secondary | ICD-10-CM | POA: Diagnosis not present

## 2020-10-10 DIAGNOSIS — R519 Headache, unspecified: Secondary | ICD-10-CM | POA: Diagnosis not present

## 2020-10-10 DIAGNOSIS — Z20822 Contact with and (suspected) exposure to covid-19: Secondary | ICD-10-CM | POA: Diagnosis not present

## 2020-10-10 DIAGNOSIS — R1084 Generalized abdominal pain: Secondary | ICD-10-CM | POA: Diagnosis not present

## 2020-10-10 DIAGNOSIS — R918 Other nonspecific abnormal finding of lung field: Secondary | ICD-10-CM | POA: Diagnosis not present

## 2020-10-10 DIAGNOSIS — R0981 Nasal congestion: Secondary | ICD-10-CM | POA: Diagnosis not present

## 2020-10-10 DIAGNOSIS — Z9049 Acquired absence of other specified parts of digestive tract: Secondary | ICD-10-CM | POA: Diagnosis not present

## 2020-10-10 DIAGNOSIS — R110 Nausea: Secondary | ICD-10-CM | POA: Diagnosis not present

## 2020-10-10 DIAGNOSIS — R1013 Epigastric pain: Secondary | ICD-10-CM | POA: Diagnosis not present

## 2020-10-10 DIAGNOSIS — R9431 Abnormal electrocardiogram [ECG] [EKG]: Secondary | ICD-10-CM | POA: Diagnosis not present

## 2020-10-10 DIAGNOSIS — R791 Abnormal coagulation profile: Secondary | ICD-10-CM | POA: Diagnosis not present

## 2020-10-10 DIAGNOSIS — R1033 Periumbilical pain: Secondary | ICD-10-CM | POA: Insufficient documentation

## 2020-10-10 DIAGNOSIS — R0602 Shortness of breath: Secondary | ICD-10-CM

## 2020-10-10 HISTORY — DX: Unspecified osteoarthritis, unspecified site: M19.90

## 2020-10-10 HISTORY — DX: Other chronic pain: G89.29

## 2020-10-10 LAB — COMPREHENSIVE METABOLIC PANEL
ALT: 16 U/L (ref 0–44)
AST: 15 U/L (ref 15–41)
Albumin: 3.5 g/dL (ref 3.5–5.0)
Alkaline Phosphatase: 55 U/L (ref 38–126)
Anion gap: 10 (ref 5–15)
BUN: 8 mg/dL (ref 6–20)
CO2: 24 mmol/L (ref 22–32)
Calcium: 8.3 mg/dL — ABNORMAL LOW (ref 8.9–10.3)
Chloride: 100 mmol/L (ref 98–111)
Creatinine, Ser: 0.53 mg/dL (ref 0.44–1.00)
GFR, Estimated: >60 mL/min (ref >60)
Glucose, Bld: 96 mg/dL (ref 70–99)
Potassium: 3.6 mmol/L (ref 3.5–5.1)
Sodium: 134 mmol/L — ABNORMAL LOW (ref 135–145)
Total Bilirubin: 0.3 mg/dL (ref 0.3–1.2)
Total Protein: 6.5 g/dL (ref 6.5–8.1)

## 2020-10-10 LAB — CBC WITH DIFFERENTIAL/PLATELET
Abs Immature Granulocytes: 0.02 10*3/uL (ref 0.00–0.07)
Basophils Absolute: 0 10*3/uL (ref 0.0–0.1)
Basophils Relative: 0 %
Eosinophils Absolute: 0 10*3/uL (ref 0.0–0.5)
Eosinophils Relative: 0 %
HCT: 38.6 % (ref 36.0–46.0)
Hemoglobin: 13.1 g/dL (ref 12.0–15.0)
Immature Granulocytes: 0 %
Lymphocytes Relative: 14 %
Lymphs Abs: 0.9 10*3/uL (ref 0.7–4.0)
MCH: 31 pg (ref 26.0–34.0)
MCHC: 33.9 g/dL (ref 30.0–36.0)
MCV: 91.3 fL (ref 80.0–100.0)
Monocytes Absolute: 0.6 10*3/uL (ref 0.1–1.0)
Monocytes Relative: 10 %
Neutro Abs: 4.9 10*3/uL (ref 1.7–7.7)
Neutrophils Relative %: 76 %
Platelets: 289 10*3/uL (ref 150–400)
RBC: 4.23 MIL/uL (ref 3.87–5.11)
RDW: 12 % (ref 11.5–15.5)
WBC: 6.4 10*3/uL (ref 4.0–10.5)
nRBC: 0 % (ref 0.0–0.2)

## 2020-10-10 LAB — URINALYSIS, ROUTINE W REFLEX MICROSCOPIC
Bilirubin Urine: NEGATIVE
Glucose, UA: NEGATIVE mg/dL
Hgb urine dipstick: NEGATIVE
Ketones, ur: NEGATIVE mg/dL
Nitrite: NEGATIVE
Protein, ur: NEGATIVE mg/dL
Specific Gravity, Urine: <1.005 — ABNORMAL LOW (ref 1.005–1.030)
pH: 7 (ref 5.0–8.0)

## 2020-10-10 LAB — TROPONIN I (HIGH SENSITIVITY)
Troponin I (High Sensitivity): <2 ng/L (ref <18)
Troponin I (High Sensitivity): <2 ng/L (ref <18)

## 2020-10-10 LAB — D-DIMER, QUANTITATIVE: D-Dimer, Quant: 1.11 ug/mL-FEU — ABNORMAL HIGH (ref 0.00–0.50)

## 2020-10-10 LAB — MAGNESIUM: Magnesium: 2.1 mg/dL (ref 1.7–2.4)

## 2020-10-10 LAB — RESP PANEL BY RT-PCR (FLU A&B, COVID) ARPGX2
Influenza A by PCR: NEGATIVE
Influenza B by PCR: NEGATIVE
SARS Coronavirus 2 by RT PCR: NEGATIVE

## 2020-10-10 LAB — URINALYSIS, MICROSCOPIC (REFLEX)

## 2020-10-10 LAB — LIPASE, BLOOD: Lipase: 31 U/L (ref 11–51)

## 2020-10-10 MED ORDER — ONDANSETRON 4 MG PO TBDP: 4.0000 mg | ORAL_TABLET | Freq: Three times a day (TID) | ORAL | 0 refills | Status: DC | PRN

## 2020-10-10 MED ORDER — SODIUM CHLORIDE 0.9 % IV BOLUS
1000.0000 mL | Freq: Once | INTRAVENOUS | Status: AC
  Administered 2020-10-10: 1000 mL via INTRAVENOUS

## 2020-10-10 MED ORDER — IOHEXOL 350 MG/ML SOLN
100.0000 mL | Freq: Once | INTRAVENOUS | Status: AC | PRN
  Administered 2020-10-10: 100 mL via INTRAVENOUS

## 2020-10-10 NOTE — Discharge Instructions (Addendum)
You were seen in the ER today for your lightheadedness, SOB, and nausea.  While the exact cause of your symptoms remains unclear, your blood work, physical exam, CT scans were very reassuring.  There is no sign of blood clot in your lungs, no sign of heart attack EKG or blood work.  Suspect component of dehydration as your heart rate improved after administration of IV fluids.  Below is the contact information for follow-up with cardiologist as your heart rate remains mildly elevated at this time.  Please follow-up additionally with your primary care doctor.  You did test negative for COVID-19 and influenza A/B.  Additionally your vital signs remained unchanged while you are up and moving. Please increase your hydration at home and follow-up with your primary care doctor.  Return to the ER if developing chest pain, worsening difficulty breathing, nausea or vomiting that does not stop, or any other concerning symptom.  You have been prescribed medication help with your

## 2020-10-10 NOTE — ED Triage Notes (Signed)
She was seen at an UC this am. Her EKG and CBG was normal. 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 They told her to come here for further evaluation.

## 2020-10-10 NOTE — ED Provider Notes (Signed)
[place] EMERGENCY DEPARTMENT Provider Note   CSN: [id number] Arrival date & time: 10/10/20  1206     History Chief Complaint  Patient presents with  . Gastroesophageal Reflux    Alyssa Hester is a 50 y.o. female who presents the prompting urgent care provider for further evaluation.  Patient has been experiencing sudden sweats, shortness of breath, and intense nausea that started suddenly last night.  She denies any true chest pain but does endorse shortness of breath at rest and DOE.  She denies any vomiting but does endorse intractable nausea since last night.  She denies any history of cardiac issues.  Denies any real abdominal pain at home, denies any diarrhea, dysuria, urinary frequency or urgency. She does endorse central frontal headache that she describes as sharp that is intermittent in the last 24 hours.  She denies any numbness, tingling, weakness in her upper extremities or lower extremities, endorses slight disequilibrium which is normal for her with history of neurofibroma.  She states she is feeling well prior to this, denies any fevers or chills, blurry vision, double vision, but does endorse significant lightheadedness associated with these episodes.  Denies any dizziness.  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   Patient was seen in urgent care today with a normal CBG and EKG but was directed to the ER for further evaluation.  I personally reviewed this patient's medical records which is history of arthritis, neurofibroma of the neck, but otherwise carries no medical diagnoses.   HPI     Past Medical History:  Diagnosis Date  . Arthritis   . Chronic pain     There are no problems to display for this patient.   Past Surgical History:  Procedure Laterality Date  . APPENDECTOMY    .  CESAREAN SECTION       OB History   No obstetric history on file.     No family history on file.  Social History   Tobacco Use  . Smoking status: Never Smoker  . Smokeless tobacco: Never Used  Substance Use Topics  . Alcohol use: Not Currently  . Drug use: Never    Home Medications Prior to Admission medications   Medication Sig Start Date End Date Taking? Authorizing Provider  cyclobenzaprine (FLEXERIL) 10 MG tablet TAKE 1 TABLET BY MOUTH THREE TIMES DAILY AS NEEDED FOR SPASMS 03/21/16  Yes [provider]  estradiol (ESTRACE) 1 MG tablet Take by mouth. 03/10/18  Yes [provider]  ondansetron (ZOFRAN ODT) 4 MG disintegrating tablet Take 1 tablet (4 mg total) by mouth every 8 (eight) hours as needed for nausea or vomiting. 10/10/20  Yes Jawana Reagor, Gypsy Balsam, PA-C  traMADol (ULTRAM) 50 MG tablet Take by mouth. 10/07/20  Yes [provider]    Allergies    Patient has no known allergies.  Review of Systems   Review of Systems  Constitutional: Positive for activity change, appetite change and fatigue. Negative for chills, diaphoresis, fever and unexpected weight change.  HENT: Negative.   Eyes: Negative.  Negative for visual disturbance.  Respiratory: Positive for shortness of breath. Negative for apnea, cough, chest tightness and wheezing.   Cardiovascular: Positive for palpitations. Negative for chest pain and leg swelling.  Gastrointestinal: Positive for abdominal pain and nausea. Negative for constipation, diarrhea and vomiting.  Endocrine: Negative for cold  intolerance, heat intolerance, polydipsia, polyphagia and polyuria.  Genitourinary: Negative.   Musculoskeletal: Positive for myalgias.  Skin: Negative.   Neurological: Positive for weakness, light-headedness and headaches. Negative for dizziness, tremors, seizures, syncope, facial asymmetry, speech difficulty and numbness.    Physical Exam Updated Vital Signs BP 117/78   Pulse (!) 107    Temp 98.4 F (36.9 C) (Oral)   Resp 17   Ht 5\' 3"  (1.6 m)   Wt 83.9 kg   SpO2 97%   BMI 32.77 kg/m   Physical Exam Vitals and nursing note reviewed.  Constitutional:      Appearance: She is not ill-appearing or toxic-appearing.  HENT:     Head: Normocephalic and atraumatic.     Nose: Nose normal.     Mouth/Throat:     Mouth: Mucous membranes are moist.     Pharynx: Oropharynx is clear. Uvula midline. No oropharyngeal exudate, posterior oropharyngeal erythema or uvula swelling.     Tonsils: No tonsillar exudate.  Eyes:     General: Lids are normal. Vision grossly intact.        Right eye: No discharge.        Left eye: No discharge.     Extraocular Movements: Extraocular movements intact.     Conjunctiva/sclera: Conjunctivae normal.     Pupils: Pupils are equal, round, and reactive to light.  Neck:     Trachea: Trachea and phonation normal.     Comments: No palpable neck mass Cardiovascular:     Rate and Rhythm: Regular rhythm. Tachycardia present.     Pulses: Normal pulses.     Heart sounds: Normal heart sounds. No murmur heard.     Comments: No carotid bruits bilaterally Pulmonary:     Effort: Pulmonary effort is normal. No tachypnea, bradypnea, accessory muscle usage, prolonged expiration or respiratory distress.     Breath sounds: Normal breath sounds. No wheezing or rales.  Chest:     Chest wall: No mass, lacerations, deformity, swelling, tenderness, crepitus or edema.  Abdominal:     General: Bowel sounds are normal. There is no distension.     Palpations: Abdomen is soft.     Tenderness: There is abdominal tenderness in the right upper quadrant, epigastric area, periumbilical area and left lower quadrant. There is no right CVA tenderness, left CVA tenderness, guarding or rebound.  Musculoskeletal:        General: No deformity.     Cervical back: Normal range of motion and neck supple. No edema, rigidity or crepitus. No pain with movement, spinous process  tenderness or muscular tenderness.     Right lower leg: No edema.     Left lower leg: No edema.  Lymphadenopathy:     Cervical: No cervical adenopathy.  Skin:    General: Skin is warm and dry.  Neurological:     Mental Status: She is alert and oriented to person, place, and time. Mental status is at baseline.     Sensory: Sensation is intact.     Motor: Motor function is intact.     Gait: Gait is intact.  Psychiatric:        Mood and Affect: Mood normal.     ED Results / Procedures / Treatments   Labs (all labs ordered are listed, but only abnormal results are displayed) Labs Reviewed  COMPREHENSIVE METABOLIC PANEL - Abnormal; Notable for the following components:      Result Value   Sodium 134 (*)    Calcium 8.3 (*)  All other components within normal limits  URINALYSIS, ROUTINE W REFLEX MICROSCOPIC - Abnormal; Notable for the following components:   Specific Gravity, Urine <1.005 (*)    Leukocytes,Ua TRACE (*)    All other components within normal limits  D-DIMER, QUANTITATIVE - Abnormal; Notable for the following components:   D-Dimer, Quant 1.11 (*)    All other components within normal limits  URINALYSIS, MICROSCOPIC (REFLEX) - Abnormal; Notable for the following components:   Bacteria, UA MANY (*)    All other components within normal limits  RESP PANEL BY RT-PCR (FLU A&B, COVID) ARPGX2  LIPASE, BLOOD  CBC WITH DIFFERENTIAL/PLATELET  MAGNESIUM  TROPONIN I (HIGH SENSITIVITY)  TROPONIN I (HIGH SENSITIVITY)    EKG EKG Interpretation  Date/Time:  Monday Oct 10 2020 14:34:29 EDT Ventricular Rate:  98 PR Interval:  130 QRS Duration: 86 QT Interval:  344 QTC Calculation: 440 R Axis:   55 Text Interpretation: Sinus rhythm Confirmed by Lennice Sites ([id number]) on 10/10/2020 3:04:11 PM   Radiology CT Angio Chest PE W/Cm &/Or Wo Cm  Result Date: 10/10/2020 CLINICAL DATA:  Elevated D-dimer and clinical suspicion of pulmonary embolism. EXAM: CT ANGIOGRAPHY CHEST WITH  CONTRAST TECHNIQUE: Multidetector CT imaging of the chest was performed using the standard protocol during bolus administration of intravenous contrast. Multiplanar CT image reconstructions and MIPs were obtained to evaluate the vascular anatomy. CONTRAST:  131mL OMNIPAQUE IOHEXOL 350 MG/ML SOLN COMPARISON:  None. FINDINGS: Cardiovascular: The pulmonary arteries are well opacified. There is no evidence of pulmonary embolism. Central pulmonary arteries are normal in caliber. The thoracic aorta is normal in caliber and demonstrates no evidence of atherosclerosis. The heart size is normal. No pericardial fluid identified. No visualized calcified coronary artery plaque. Mediastinum/Nodes: No enlarged mediastinal, hilar, or axillary lymph nodes. Thyroid gland, trachea, and esophagus demonstrate no significant findings. Lungs/Pleura: There is no evidence of pulmonary edema, consolidation, pneumothorax, nodule or pleural fluid. Upper Abdomen: The liver demonstrates steatosis. Musculoskeletal: No chest wall abnormality. No acute or significant osseous findings. Review of the MIP images confirms the above findings. IMPRESSION: 1. No evidence of pulmonary embolism or other acute findings. 2. Hepatic steatosis. Electronically Signed   By: Aletta Edouard M.D.   On: 10/10/2020 16:10   DG Chest Portable 1 View  Result Date: 10/10/2020 CLINICAL DATA:  Cardiac palpitations EXAM: PORTABLE CHEST 1 VIEW COMPARISON:  December 22, 2010 FINDINGS: Lungs are clear. Heart size and pulmonary vascularity are normal. No adenopathy. There is degenerative change in the thoracic spine. IMPRESSION: Lungs clear.  Cardiac silhouette normal. Electronically Signed   By: Lowella Grip III M.D.   On: 10/10/2020 14:55   CT Renal Stone Study  Result Date: 10/10/2020 CLINICAL DATA:  Generalized abdominal pain EXAM: CT ABDOMEN AND PELVIS WITHOUT CONTRAST TECHNIQUE: Multidetector CT imaging of the abdomen and pelvis was performed following the  standard protocol without IV contrast. COMPARISON:  CT 10/10/2020 FINDINGS: Lower chest: Lung bases are clear. Hepatobiliary: No focal liver abnormality is seen. No gallstones, gallbladder wall thickening, or biliary dilatation. Pancreas: Unremarkable. No pancreatic ductal dilatation or surrounding inflammatory changes. Spleen: Normal in size without focal abnormality. Adrenals/Urinary Tract: Adrenal glands are normal. Excreted contrast in the renal collecting systems and bladder. Stomach/Bowel: Stomach is within normal limits. History of prior appendectomy. No evidence of bowel wall thickening, distention, or inflammatory changes. Vascular/Lymphatic: No significant vascular findings are present. No enlarged abdominal or pelvic lymph nodes. Reproductive: IUD in the uterus.  No adnexal mass. Other: No abdominal wall hernia  or abnormality. No abdominopelvic ascites. Musculoskeletal: No acute or significant osseous findings. IMPRESSION: No CT evidence for acute intra-abdominal or pelvic abnormality. Electronically Signed   By: Donavan Foil M.D.   On: 10/10/2020 18:16    Procedures Procedures   Medications Ordered in ED Medications  iohexol (OMNIPAQUE) 350 MG/ML injection 100 mL (100 mLs Intravenous Contrast Given 10/10/20 1546)  sodium chloride 0.9 % bolus 1,000 mL (0 mLs Intravenous Stopped 10/10/20 1859)    ED Course  I have reviewed the triage vital signs and the nursing notes.  Pertinent labs & imaging results that were available during my care of the patient were reviewed by me and considered in my medical decision making (see chart for details).    MDM Rules/Calculators/A&P                         50 year old female presents with concern for 24 hours of intermittent episodes of diaphoresis, severe lightheadedness and near syncope, frontal headache, palpitations, and constant shortness of breath.  Differential diagnosis includes but is not limited to ACS, PE, pleural effusion, pneumonia,  dysrhythmia, infectious etiology such as COVID-19; differential for abdominal pain on exam glucose is limited to gastritis, pancreatitis, AAA, diverticulitis.  Tachycardic on intake, vital signs otherwise normal.  Patient remains tachycardic at time of exam, no murmurs, pulmonary exam is normal, abdominal exam with epigastric, periumbilical, and left lower quadrant tenderness to palpation without guarding or rebound.  Patient is neurovascularly intact in all 4 extremities, there is no lower extremity edema.  We will proceed with laboratory studies, EKG, and chest x-ray, will consider further abdominal limaging pending lab.  Chest x-ray negative for acute cardiopulmonary disease.  CBC is unremarkable, CMP with mild hyponatremia of 134.  UA with trace leukocytes and many bacteria, patient without any urinary symptoms.  Lipase is normal, magnesium is normal, troponin is less than 2.  D-dimer is elevated to 1.1.  We will proceed with CT angiogram at this time.  Respiratory pathogen panel negative for COVID-19 and influenza A/B.  CT angiogram negative for pulmonary embolism or other acute findings on the chest.  Subsequently patient endorsed worsening abdominal pain to this provider.  CT renal study was performed without acute finding in the abdomen or pelvis to explain patient's symptoms.  Patient reevaluated and feeling improved after administration of IV fluid resuscitation.  Remains mildly tachycardic to 105 to 110 bpm.  Patient ambulated without decrease in her SPO2 on room air, orthostatic vital signs are normal.  Tachycardia improved to heart rate of 105 at this time following IV fluids.  Suspect dehydration. No further work-up warranted in the ED at this time.  While exact etiology for patient's symptoms remains unclear, there does not appear to be any emergent source for the patient's symptoms at this time.  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  Demi voiced understanding of her medical evaluation and treatment plan.  Each of her questions was answered to her expressed satisfaction.  Strict return precautions were given.  Patient is well-appearing, stable, and appropriate for discharge at this time.  This chart was dictated using voice recognition software, Dragon. Despite the best efforts of this provider to proofread and correct errors, errors may still occur which can change documentation meaning.   Final Clinical Impression(s) / ED Diagnoses Final diagnoses:  SOB (shortness of breath)  Tachycardia  Rx / DC Orders ED Discharge Orders         Ordered    ondansetron (ZOFRAN ODT) 4 MG disintegrating tablet  Every 8 hours PRN        10/10/20 2049           Iveliz Garay, Gypsy Balsam, PA-C 10/11/20 1223    Lennice Sites, DO 10/11/20 1531

## 2020-10-10 NOTE — ED Notes (Signed)
Pt ambulated w/ pulse ox, independently, w/o complaints of SOB. Pt's pulse >90% while ambulating

## 2020-10-12 DIAGNOSIS — R5383 Other fatigue: Secondary | ICD-10-CM | POA: Diagnosis not present

## 2020-10-13 DIAGNOSIS — G894 Chronic pain syndrome: Secondary | ICD-10-CM | POA: Diagnosis not present

## 2020-10-20 ENCOUNTER — Encounter: Payer: Self-pay | Admitting: Cardiology

## 2020-10-20 ENCOUNTER — Ambulatory Visit (INDEPENDENT_AMBULATORY_CARE_PROVIDER_SITE_OTHER): Payer: BC Managed Care – PPO | Admitting: Cardiology

## 2020-10-20 ENCOUNTER — Other Ambulatory Visit: Payer: Self-pay

## 2020-10-20 VITALS — BP 129/86 | HR 123 | Ht 63.0 in | Wt 186.1 lb

## 2020-10-20 DIAGNOSIS — R000 Tachycardia, unspecified: Secondary | ICD-10-CM

## 2020-10-20 DIAGNOSIS — Z01810 Encounter for preprocedural cardiovascular examination: Secondary | ICD-10-CM | POA: Diagnosis not present

## 2020-10-20 DIAGNOSIS — R42 Dizziness and giddiness: Secondary | ICD-10-CM | POA: Insufficient documentation

## 2020-10-20 DIAGNOSIS — M1990 Unspecified osteoarthritis, unspecified site: Secondary | ICD-10-CM | POA: Insufficient documentation

## 2020-10-20 DIAGNOSIS — G8929 Other chronic pain: Secondary | ICD-10-CM | POA: Insufficient documentation

## 2020-10-20 NOTE — Patient Instructions (Signed)
Medication Instructions:  Your physician has recommended you make the following change in your medication:  STOP: Flexeril  *If you need a refill on your cardiac medications before your next appointment, please call your pharmacy*   Lab Work: None If you have labs (blood work) drawn today and your tests are completely normal, you will receive your results only by: Marland Kitchen MyChart Message (if you have MyChart) OR . A paper copy in the mail If you have any lab test that is abnormal or we need to change your treatment, we will call you to review the results.   Testing/Procedures: Your physician has requested that you have an echocardiogram. Echocardiography is a painless test that uses sound waves to create images of your heart. It provides your doctor with information about the size and shape of your heart and how well your heart's chambers and valves are working. This procedure takes approximately one hour. There are no restrictions for this procedure.     Follow-Up: At [facility], you and your health needs are our priority.  As part of our continuing mission to provide you with exceptional heart care, we have created designated Provider Care Teams.  These Care Teams include your primary Cardiologist (physician) and Advanced Practice Providers (APPs -  Physician Assistants and Nurse Practitioners) who all work together to provide you with the care you need, when you need it.  We recommend signing up for the patient portal called "MyChart".  Sign up information is provided on this After Visit Summary.  MyChart is used to connect with patients for Virtual Visits (Telemedicine).  Patients are able to view lab/test results, encounter notes, upcoming appointments, etc.  Non-urgent messages can be sent to your provider as well.   To learn more about what you can do with MyChart, go to [email or web address].    Your next appointment:   As needed  The format for your next appointment:   In  Person  Provider:   Shirlee More, MD   Other Instructions

## 2020-10-20 NOTE — Progress Notes (Addendum)
Cardiology Office Note:    Date:  10/20/2020   ID:  Alyssa Hester, DOB 1970-08-31, MRN [id number]  PCP:  Gennette Pac, NP  Cardiologist:  Shirlee More, MD   Referring MD: Gennette Pac, NP  ASSESSMENT:    1. Tachycardia   2. Sinus tachycardia   3. Episodic lightheadedness   4. Preoperative cardiovascular examination    PLAN:    In order of problems listed above:  1. 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  Stop the drug trend her heart rates at home with a smart watch and contact me in a week and if rates remain greater than 110 we will place her on a low-dose of the beta-blocker.  For further preoperative evaluation I have been asked her to have an echocardiogram done for completeness I do not suspect she has cardiomyopathy.  From my perspective I will plan on proceeding with her neurosurgery please put her in a monitored bed postoperatively EKG postoperative day 1 and if any ongoing cardiac issues contact heart care to see postoperatively. 2. Lightheadedness secondary to combination of cord compression and alpha-adrenergic okayed from cyclobenzaprine discontinue the drug 3. Provided the  echocardiogram shows preserved left ventricular function and no findings of cardiomyopathy she is optimized for planned surgical procedure her revised surgical risk score is 0 and she has a normal exercise tolerance  Next appointment as needed  Her echocardiogram 10/21/2020 shows normal left and right ventricular function no valvular abnormality. Proceed with planned spine surgery.   Medication Adjustments/Labs and Tests Ordered: Current medicines are reviewed at length with the patient today.  Concerns regarding medicines are outlined above.  Orders Placed This Encounter   Procedures  . EKG 12-Lead  . ECHOCARDIOGRAM COMPLETE   No orders of the defined types were placed in this encounter.    Chief Complaint  Patient presents with  . Tachycardia    ED visit lightheadedness rapid heart rate    History of Present Illness:    Alyssa Hester is a 50 y.o. female who is being seen today for the evaluation of shortness of breath after recent ED visit 10/10/2020 the ED note says that she did not have chest pain but she had shortness of breath diaphoresis and intense nausea.  She is seen at the request of Gennette Pac, NP..  I reviewed the ED note at the end that says the recommendations to follow-up with her primary care physician while there is no exact etiology of her symptoms there is not appear to be an emergent source at that time.  Discharge diagnosis with shortness of breath and tachypnea. Her EKG showed sinus rhythm was normal chest x-ray showed no acute findings CT chest PE protocol showed no evidence of pulmonary embolus she was tachycardic and received IV fluids.  High-sensitivity troponin was normal hemoglobin 13.1 D-dimer was elevated 1.11 and CT angio of the chest showed normal cardiovascular structures.  She is pending neurosurgery for multilevel C3-C4 C4-C5 for surgical resection of neurofibroma with fixation C3-C5.  EXAM: CT ANGIOGRAPHY CHEST WITH CONTRAST   TECHNIQUE: Multidetector CT imaging of the chest was performed using the standard protocol during bolus administration of intravenous contrast. Multiplanar CT image reconstructions and MIPs were obtained to evaluate the vascular anatomy.   CONTRAST:  168mL OMNIPAQUE IOHEXOL 350 MG/ML SOLN  COMPARISON:  None.   FINDINGS: Cardiovascular: The pulmonary arteries are well opacified. There is no evidence of pulmonary embolism. Central pulmonary arteries are normal in caliber. The thoracic aorta is normal in caliber and demonstrates no evidence of atherosclerosis. The heart size  is normal. No pericardial fluid identified. No visualized calcified coronary artery plaque.   Mediastinum/Nodes: No enlarged mediastinal, hilar, or axillary lymph nodes. Thyroid gland, trachea, and esophagus demonstrate no significant findings.   Lungs/Pleura: There is no evidence of pulmonary edema, consolidation, pneumothorax, nodule or pleural fluid.   Upper Abdomen: The liver demonstrates steatosis.   Musculoskeletal: No chest wall abnormality. No acute or significant osseous findings.   Review of the MIP images confirms the above findings.   IMPRESSION: 1. No evidence of pulmonary embolism or other acute findings. 2. Hepatic steatosis.   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 She is not particularly aware of her heartbeat being rapid She has had no orthostatic symptoms and was in the ED with what is best described as a near syncopal episode when she was outside doing activity. No palpitation chest pain or syncope. She has no known history of heart disease Is a normal exercise tolerance and continues to work No history of venous thromboembolism. She is fully vaccinated and boosted but did have COVID-19 mild illness Past Medical History:  Diagnosis Date  . Arthritis   . Chronic pain     Past Surgical History:  Procedure Laterality Date  . APPENDECTOMY    . CESAREAN SECTION    . WISDOM TOOTH EXTRACTION  1993    Current Medications: Current Meds  Medication Sig  . estradiol (ESTRACE) 1 MG tablet Take by mouth.  Marland Kitchen HYDROcodone-acetaminophen (NORCO/VICODIN) 5-325 MG tablet Take 1 tablet by mouth 2 (two) times daily as needed for severe pain.  Marland Kitchen levonorgestrel (MIRENA, 52 MG,) 20 MCG/DAY IUD Mirena 20 mcg/24 hours (7 yrs) 52 mg intrauterine device  Take 1 device by intrauterine route.  .  traMADol (ULTRAM) 50 MG tablet Take 50 mg by mouth daily.  . [DISCONTINUED] cyclobenzaprine (FLEXERIL) 10 MG tablet Take 10 mg by mouth at bedtime.     Allergies:   Codeine, Latex, Other, and Sulfa antibiotics   Social History   Socioeconomic History  . Marital status: Married    Spouse name: Not on file  . Number of children: Not on file  . Years of education: Not on file  . Highest education level: Not on file  Occupational History  . Not on file  Tobacco Use  . Smoking status: Never Smoker  . Smokeless tobacco: Never Used  Substance and Sexual Activity  . Alcohol use: Not Currently  . Drug use: Never  . Sexual activity: Not on file  Other Topics Concern  . Not on file  Social History Narrative  . Not on file   Social Determinants of Health   Financial Resource Strain: Not on file  Food Insecurity: Not on file  Transportation Needs: Not on file  Physical Activity: Not on file  Stress: Not on file  Social Connections: Not on file     Family History: The patient's family history includes Allergies in her brother; Breast cancer in her maternal grandmother; Heart attack in her maternal grandfather; High Cholesterol in her mother; Hypertension in her father; Lung cancer in her  paternal grandmother.  ROS:   ROS Please see the history of present illness.     All other systems reviewed and are negative.  EKGs/Labs/Other Studies Reviewed:    The following studies were reviewed today:   EKG:  EKG is  ordered today.  The ekg ordered today is personally reviewed and demonstrates sinus tachycardia 123 bpm normal QT interval otherwise normal  Recent Labs: 10/10/2020: ALT 16; BUN 8; Creatinine, Ser 0.53; Hemoglobin 13.1; Magnesium 2.1; Platelets 289; Potassium 3.6; Sodium 134    Physical Exam:    VS:  BP 129/86   Pulse (!) 123   Ht 5\' 3"  (1.6 m)   Wt 186 lb 1.3 oz (84.4 kg)   SpO2 98%   BMI 32.96 kg/m     Wt Readings from Last 3 Encounters:  10/20/20 186 lb 1.3 oz  (84.4 kg)  10/10/20 185 lb (83.9 kg)     GEN: Does not look chronically ill heart rate at rest persistently rapid presently 117 bpm well nourished, well developed in no acute distress HEENT: Normal NECK: No JVD; No carotid bruits LYMPHATICS: No lymphadenopathy CARDIAC: RRR, no murmurs, rubs, gallops RESPIRATORY:  Clear to auscultation without rales, wheezing or rhonchi  ABDOMEN: Soft, non-tender, non-distended MUSCULOSKELETAL:  No edema; No deformity  SKIN: Warm and dry NEUROLOGIC:  Alert and oriented x 3 PSYCHIATRIC:  Normal affect     Signed, Shirlee More, MD  10/20/2020 2:23 PM    [facility]

## 2020-10-21 ENCOUNTER — Ambulatory Visit (HOSPITAL_BASED_OUTPATIENT_CLINIC_OR_DEPARTMENT_OTHER)
Admission: RE | Admit: 2020-10-21 | Discharge: 2020-10-21 | Disposition: A | Discharge status: Home / Self Care | Payer: BC Managed Care – PPO | Source: Ambulatory Visit | Attending: Cardiology | Admitting: Cardiology

## 2020-10-21 DIAGNOSIS — Z01810 Encounter for preprocedural cardiovascular examination: Secondary | ICD-10-CM

## 2020-10-21 DIAGNOSIS — R000 Tachycardia, unspecified: Secondary | ICD-10-CM | POA: Diagnosis not present

## 2020-10-21 LAB — ECHOCARDIOGRAM COMPLETE
AR max vel: 2.05 cm2
AV Area VTI: 2.3 cm2
AV Area mean vel: 1.76 cm2
AV Mean grad: 3 mmHg
AV Peak grad: 6.6 mmHg
Ao pk vel: 1.28 m/s
Area-P 1/2: 2.74 cm2
Calc EF: 68.5 %
S' Lateral: 2.53 cm
Single Plane A2C EF: 71.5 %
Single Plane A4C EF: 68.2 %

## 2020-10-24 ENCOUNTER — Telehealth: Payer: Self-pay

## 2020-10-24 NOTE — Telephone Encounter (Signed)
Spoke with patient regarding results and recommendation.  Patient verbalizes understanding and is agreeable to plan of care. Advised patient to call back with any issues or concerns.  

## 2020-10-24 NOTE — Telephone Encounter (Signed)
-----   Message from Richardo Priest, MD sent at 10/21/2020  5:49 PM EDT ----- Doristine Devoid results normal  I amended and sent a copy of the note to her neurosurgeon advised him to proceed with planned surgery.

## 2020-10-24 NOTE — Telephone Encounter (Signed)
Patient is returning call.  °

## 2020-10-24 NOTE — Telephone Encounter (Signed)
Left message on patients voicemail to please return our call.   

## 2020-10-27 DIAGNOSIS — D3611 Benign neoplasm of peripheral nerves and autonomic nervous system of face, head, and neck: Secondary | ICD-10-CM | POA: Diagnosis not present

## 2020-11-01 ENCOUNTER — Ambulatory Visit: Payer: BC Managed Care – PPO | Admitting: Cardiology

## 2020-11-09 NOTE — Pre-Procedure Instructions (Signed)
Surgical Instructions    Your procedure is scheduled on Monday June 6th.  Report to [facility] Main Entrance "A" at 07:55 A.M., then check in with the Admitting office.  Call this number if you have problems the morning of surgery:  [phone]   If you have any questions prior to your surgery date call [phone]: Open Monday-Friday 8am-4pm    Remember:  Do not eat or drink after midnight the night before your surgery     Take these medicines the morning of surgery with A SIP OF WATER   acetaminophen (TYLENOL)- IF NEEDED  HYDROcodone-acetaminophen (NORCO/VICODIN)- IF NEEDED  traMADol (ULTRAM)     As of today, STOP taking any Aspirin (unless otherwise instructed by your surgeon) Aleve, Naproxen, Ibuprofen, Motrin, Advil, Goody's, BC's, all herbal medications, fish oil, and all vitamins.                     Do not wear jewelry, make up, or nail polish DO Not wear nail polish, gel polish, artificial nails, or any other type of covering on natural nails including finger and toenails. If patients have artificial nails, gel coating, etc. that need to be removed by a nail saloon please have this removed prior to surgery or surgery may need to be canceled/delayed if the surgeon/ anesthesia feels like the patient is unable to be adequately monitored.            Do not wear lotions, powders, perfumes/colognes, or deodorant.            Do not shave 48 hours prior to surgery.  Men may shave face and neck.            Do not bring valuables to the hospital.            [facility] is not responsible for any belongings or valuables.  Do NOT Smoke (Tobacco/Vaping) or drink Alcohol 24 hours prior to your procedure If you use a CPAP at night, you may bring all equipment for your overnight stay.   Contacts, glasses, dentures or bridgework may not be worn into surgery, please bring cases for these belongings   For patients admitted to the hospital, discharge time will be determined by your  treatment team.   Patients discharged the day of surgery will not be allowed to drive home, and someone needs to stay with them for 24 hours.    Special instructions:    Oral Hygiene is also important to reduce your risk of infection.  Remember - BRUSH YOUR TEETH THE MORNING OF SURGERY WITH YOUR REGULAR TOOTHPASTE   [place]- Preparing For Surgery  Before surgery, you can play an important role. Because skin is not sterile, your skin needs to be as free of germs as possible. You can reduce the number of germs on your skin by washing with CHG (chlorahexidine gluconate) Soap before surgery.  CHG is an antiseptic cleaner which kills germs and bonds with the skin to continue killing germs even after washing.     Please do not use if you have an allergy to CHG or antibacterial soaps. If your skin becomes reddened/irritated stop using the CHG.  Do not shave (including legs and underarms) for at least 48 hours prior to first CHG shower. It is OK to shave your face.  Please follow these instructions carefully.    1.  Shower the NIGHT BEFORE SURGERY and the MORNING OF SURGERY with CHG Soap.   If you chose to  wash your hair, wash your hair first as usual with your normal shampoo. After you shampoo, rinse your hair and body thoroughly to remove the shampoo.  Then [employer] and genitals (private parts) with your normal soap and rinse thoroughly to remove soap.  2. After that Use CHG Soap as you would any other liquid soap. You can apply CHG directly to the skin and wash gently with a scrungie or a clean washcloth.   3. Apply the CHG Soap to your body ONLY FROM THE NECK DOWN.  Do not use on open wounds or open sores. Avoid contact with your eyes, ears, mouth and genitals (private parts). Wash Face and genitals (private parts)  with your normal soap.   4. Wash thoroughly, paying special attention to the area where your surgery will be performed.  5. Thoroughly rinse your body with warm water from  the neck down.  6. DO NOT shower/wash with your normal soap after using and rinsing off the CHG Soap.  7. Pat yourself dry with a CLEAN TOWEL.  8. Wear CLEAN PAJAMAS to bed the night before surgery  9. Place CLEAN SHEETS on your bed the night before your surgery  10. DO NOT SLEEP WITH PETS.   Day of Surgery:  Take a shower with CHG soap. Wear Clean/Comfortable clothing the morning of surgery Do not apply any deodorants/lotions.   Remember to brush your teeth WITH YOUR REGULAR TOOTHPASTE.   Please read over the following fact sheets that you were given.

## 2020-11-10 ENCOUNTER — Encounter (HOSPITAL_COMMUNITY): Payer: Self-pay

## 2020-11-10 ENCOUNTER — Encounter (HOSPITAL_COMMUNITY)
Admission: RE | Admit: 2020-11-10 | Discharge: 2020-11-10 | Disposition: A | Discharge status: Home / Self Care | Payer: BC Managed Care – PPO | Source: Ambulatory Visit | Attending: Neurosurgery | Admitting: Neurosurgery

## 2020-11-10 ENCOUNTER — Other Ambulatory Visit: Payer: Self-pay

## 2020-11-10 DIAGNOSIS — Z01812 Encounter for preprocedural laboratory examination: Secondary | ICD-10-CM | POA: Diagnosis not present

## 2020-11-10 DIAGNOSIS — Z20822 Contact with and (suspected) exposure to covid-19: Secondary | ICD-10-CM | POA: Insufficient documentation

## 2020-11-10 HISTORY — DX: Nausea with vomiting, unspecified: R11.2

## 2020-11-10 HISTORY — DX: Other specified postprocedural states: Z98.890

## 2020-11-10 HISTORY — DX: Unspecified asthma, uncomplicated: J45.909

## 2020-11-10 LAB — CBC
HCT: 38.9 % (ref 36.0–46.0)
Hemoglobin: 12.9 g/dL (ref 12.0–15.0)
MCH: 30.7 pg (ref 26.0–34.0)
MCHC: 33.2 g/dL (ref 30.0–36.0)
MCV: 92.6 fL (ref 80.0–100.0)
Platelets: 347 10*3/uL (ref 150–400)
RBC: 4.2 MIL/uL (ref 3.87–5.11)
RDW: 12 % (ref 11.5–15.5)
WBC: 8.5 10*3/uL (ref 4.0–10.5)
nRBC: 0 % (ref 0.0–0.2)

## 2020-11-10 LAB — BASIC METABOLIC PANEL
Anion gap: 5 (ref 5–15)
BUN: 7 mg/dL (ref 6–20)
CO2: 28 mmol/L (ref 22–32)
Calcium: 9.1 mg/dL (ref 8.9–10.3)
Chloride: 104 mmol/L (ref 98–111)
Creatinine, Ser: 0.6 mg/dL (ref 0.44–1.00)
GFR, Estimated: >60 mL/min (ref >60)
Glucose, Bld: 97 mg/dL (ref 70–99)
Potassium: 4.2 mmol/L (ref 3.5–5.1)
Sodium: 137 mmol/L (ref 135–145)

## 2020-11-10 LAB — SURGICAL PCR SCREEN
MRSA, PCR: NEGATIVE
Staphylococcus aureus: NEGATIVE

## 2020-11-10 NOTE — Progress Notes (Signed)
PCP -  Elisha Ponder, NP [facility] Cardiologist - Dr. Bettina Gavia  PPM/ICD - n/a Device Orders - n/a Rep Notified - n/a  Chest x-ray - 10/10/20 EKG - 10/20/20 Stress Test - denies ECHO - 10/21/20 Cardiac Cath - denies  Sleep Study - denies CPAP - n/a  Fasting Blood Sugar - n/a Checks Blood Sugar: n/a  Blood Thinner Instructions: n/a Aspirin Instructions: n/a   COVID TEST-  11/10/20. Pending   Anesthesia review: No  Patient denies shortness of breath, fever, cough and chest pain at PAT appointment   All instructions explained to the patient, with a verbal understanding of the material. Patient agrees to go over the instructions while at home for a better understanding. Patient also instructed to self quarantine after being tested for COVID-19. The opportunity to ask questions was provided.

## 2020-11-11 LAB — SARS CORONAVIRUS 2 (TAT 6-24 HRS): SARS Coronavirus 2: NEGATIVE

## 2020-11-14 ENCOUNTER — Encounter (HOSPITAL_COMMUNITY): Payer: Self-pay | Admitting: Neurosurgery

## 2020-11-14 ENCOUNTER — Inpatient Hospital Stay (HOSPITAL_COMMUNITY): Payer: BC Managed Care – PPO | Admitting: Anesthesiology

## 2020-11-14 ENCOUNTER — Encounter (HOSPITAL_COMMUNITY)
Admission: RE | Disposition: A | Discharge status: Home / Self Care | Payer: Self-pay | Source: Home / Self Care | Attending: Neurosurgery

## 2020-11-14 ENCOUNTER — Inpatient Hospital Stay (HOSPITAL_COMMUNITY)
Admission: RE | Admit: 2020-11-14 | Discharge: 2020-11-16 | DRG: 472 | Disposition: A | Discharge status: Home / Self Care | Payer: BC Managed Care – PPO | Attending: Neurosurgery | Admitting: Neurosurgery

## 2020-11-14 ENCOUNTER — Inpatient Hospital Stay (HOSPITAL_COMMUNITY): Payer: BC Managed Care – PPO

## 2020-11-14 DIAGNOSIS — Z91040 Latex allergy status: Secondary | ICD-10-CM | POA: Diagnosis not present

## 2020-11-14 DIAGNOSIS — Z975 Presence of (intrauterine) contraceptive device: Secondary | ICD-10-CM

## 2020-11-14 DIAGNOSIS — M50223 Other cervical disc displacement at C6-C7 level: Secondary | ICD-10-CM | POA: Diagnosis not present

## 2020-11-14 DIAGNOSIS — Z801 Family history of malignant neoplasm of trachea, bronchus and lung: Secondary | ICD-10-CM

## 2020-11-14 DIAGNOSIS — Z885 Allergy status to narcotic agent status: Secondary | ICD-10-CM | POA: Diagnosis not present

## 2020-11-14 DIAGNOSIS — M160 Bilateral primary osteoarthritis of hip: Secondary | ICD-10-CM | POA: Diagnosis present

## 2020-11-14 DIAGNOSIS — Z981 Arthrodesis status: Secondary | ICD-10-CM | POA: Diagnosis not present

## 2020-11-14 DIAGNOSIS — Z8349 Family history of other endocrine, nutritional and metabolic diseases: Secondary | ICD-10-CM | POA: Diagnosis not present

## 2020-11-14 DIAGNOSIS — D3611 Benign neoplasm of peripheral nerves and autonomic nervous system of face, head, and neck: Principal | ICD-10-CM | POA: Diagnosis present

## 2020-11-14 DIAGNOSIS — Z882 Allergy status to sulfonamides status: Secondary | ICD-10-CM

## 2020-11-14 DIAGNOSIS — Z9889 Other specified postprocedural states: Secondary | ICD-10-CM | POA: Diagnosis not present

## 2020-11-14 DIAGNOSIS — Z8249 Family history of ischemic heart disease and other diseases of the circulatory system: Secondary | ICD-10-CM

## 2020-11-14 DIAGNOSIS — Z803 Family history of malignant neoplasm of breast: Secondary | ICD-10-CM

## 2020-11-14 DIAGNOSIS — G9529 Other cord compression: Secondary | ICD-10-CM | POA: Diagnosis present

## 2020-11-14 DIAGNOSIS — M4322 Fusion of spine, cervical region: Secondary | ICD-10-CM | POA: Diagnosis not present

## 2020-11-14 DIAGNOSIS — G8929 Other chronic pain: Secondary | ICD-10-CM | POA: Diagnosis present

## 2020-11-14 DIAGNOSIS — C719 Malignant neoplasm of brain, unspecified: Secondary | ICD-10-CM | POA: Diagnosis not present

## 2020-11-14 DIAGNOSIS — D3610 Benign neoplasm of peripheral nerves and autonomic nervous system, unspecified: Secondary | ICD-10-CM | POA: Diagnosis present

## 2020-11-14 DIAGNOSIS — M4690 Unspecified inflammatory spondylopathy, site unspecified: Secondary | ICD-10-CM | POA: Diagnosis not present

## 2020-11-14 DIAGNOSIS — Z79899 Other long term (current) drug therapy: Secondary | ICD-10-CM | POA: Diagnosis not present

## 2020-11-14 DIAGNOSIS — D492 Neoplasm of unspecified behavior of bone, soft tissue, and skin: Secondary | ICD-10-CM | POA: Diagnosis not present

## 2020-11-14 DIAGNOSIS — Z419 Encounter for procedure for purposes other than remedying health state, unspecified: Secondary | ICD-10-CM

## 2020-11-14 DIAGNOSIS — D434 Neoplasm of uncertain behavior of spinal cord: Secondary | ICD-10-CM | POA: Diagnosis not present

## 2020-11-14 DIAGNOSIS — Z793 Long term (current) use of hormonal contraceptives: Secondary | ICD-10-CM

## 2020-11-14 DIAGNOSIS — G9520 Unspecified cord compression: Secondary | ICD-10-CM | POA: Diagnosis not present

## 2020-11-14 DIAGNOSIS — G992 Myelopathy in diseases classified elsewhere: Secondary | ICD-10-CM | POA: Diagnosis not present

## 2020-11-14 DIAGNOSIS — C729 Malignant neoplasm of central nervous system, unspecified: Secondary | ICD-10-CM | POA: Diagnosis not present

## 2020-11-14 HISTORY — PX: POSTERIOR CERVICAL FUSION/FORAMINOTOMY: SHX5038

## 2020-11-14 LAB — TYPE AND SCREEN
ABO/RH(D): A NEG
Antibody Screen: NEGATIVE

## 2020-11-14 LAB — ABO/RH: ABO/RH(D): A NEG

## 2020-11-14 SURGERY — POSTERIOR CERVICAL FUSION/FORAMINOTOMY LEVEL 2
Anesthesia: General

## 2020-11-14 MED ORDER — MIDAZOLAM HCL 2 MG/2ML IJ SOLN
INTRAMUSCULAR | Status: AC
  Filled 2020-11-14: qty 2

## 2020-11-14 MED ORDER — BUPIVACAINE HCL (PF) 0.25 % IJ SOLN
INTRAMUSCULAR | Status: AC
  Filled 2020-11-14: qty 30

## 2020-11-14 MED ORDER — SODIUM CHLORIDE 0.9% FLUSH
3.0000 mL | Freq: Two times a day (BID) | INTRAVENOUS | Status: DC
  Administered 2020-11-15 (×2): 3 mL via INTRAVENOUS

## 2020-11-14 MED ORDER — SODIUM CHLORIDE 0.9 % IV SOLN
250.0000 mL | INTRAVENOUS | Status: DC
  Administered 2020-11-14: 250 mL via INTRAVENOUS

## 2020-11-14 MED ORDER — THROMBIN 20000 UNITS EX SOLR
CUTANEOUS | Status: DC | PRN
  Administered 2020-11-14: 20 mL via TOPICAL

## 2020-11-14 MED ORDER — DEXAMETHASONE SODIUM PHOSPHATE 10 MG/ML IJ SOLN
10.0000 mg | Freq: Once | INTRAMUSCULAR | Status: AC
  Administered 2020-11-14: 10 mg via INTRAVENOUS

## 2020-11-14 MED ORDER — ROCURONIUM BROMIDE 10 MG/ML (PF) SYRINGE
PREFILLED_SYRINGE | INTRAVENOUS | Status: DC | PRN
  Administered 2020-11-14: 20 mg via INTRAVENOUS
  Administered 2020-11-14 (×2): 30 mg via INTRAVENOUS
  Administered 2020-11-14: 20 mg via INTRAVENOUS
  Administered 2020-11-14: 70 mg via INTRAVENOUS

## 2020-11-14 MED ORDER — CEFAZOLIN SODIUM-DEXTROSE 2-4 GM/100ML-% IV SOLN
2.0000 g | Freq: Three times a day (TID) | INTRAVENOUS | Status: DC
  Administered 2020-11-14 – 2020-11-15 (×4): 2 g via INTRAVENOUS
  Filled 2020-11-14 (×4): qty 100

## 2020-11-14 MED ORDER — DEXAMETHASONE SODIUM PHOSPHATE 10 MG/ML IJ SOLN
10.0000 mg | Freq: Once | INTRAMUSCULAR | Status: AC
  Administered 2020-11-14: 10 mg via INTRAVENOUS
  Filled 2020-11-14: qty 1

## 2020-11-14 MED ORDER — PROPOFOL 10 MG/ML IV BOLUS
INTRAVENOUS | Status: DC | PRN
  Administered 2020-11-14: 200 mg via INTRAVENOUS
  Administered 2020-11-14: 100 mg via INTRAVENOUS

## 2020-11-14 MED ORDER — CHLORHEXIDINE GLUCONATE 0.12 % MT SOLN
15.0000 mL | Freq: Once | OROMUCOSAL | Status: AC
  Administered 2020-11-14: 15 mL via OROMUCOSAL
  Filled 2020-11-14: qty 15

## 2020-11-14 MED ORDER — PROPOFOL 500 MG/50ML IV EMUL
INTRAVENOUS | Status: DC | PRN
  Administered 2020-11-14: 50 ug/kg/min via INTRAVENOUS

## 2020-11-14 MED ORDER — ONDANSETRON HCL 4 MG PO TABS
4.0000 mg | ORAL_TABLET | Freq: Four times a day (QID) | ORAL | Status: DC | PRN
  Administered 2020-11-15: 4 mg via ORAL
  Filled 2020-11-14: qty 1

## 2020-11-14 MED ORDER — BUPIVACAINE HCL (PF) 0.25 % IJ SOLN
INTRAMUSCULAR | Status: DC | PRN
  Administered 2020-11-14: 10 mL

## 2020-11-14 MED ORDER — OXYCODONE HCL 5 MG PO TABS: 10.0000 mg | ORAL_TABLET | ORAL | Status: DC | PRN

## 2020-11-14 MED ORDER — SODIUM CHLORIDE 0.9% FLUSH: 3.0000 mL | INTRAVENOUS | Status: DC | PRN

## 2020-11-14 MED ORDER — METHOCARBAMOL 500 MG PO TABS
500.0000 mg | ORAL_TABLET | Freq: Four times a day (QID) | ORAL | Status: DC | PRN
  Administered 2020-11-15 – 2020-11-16 (×6): 500 mg via ORAL
  Filled 2020-11-14 (×6): qty 1

## 2020-11-14 MED ORDER — PHENYLEPHRINE 40 MCG/ML (10ML) SYRINGE FOR IV PUSH (FOR BLOOD PRESSURE SUPPORT)
PREFILLED_SYRINGE | INTRAVENOUS | Status: AC
  Filled 2020-11-14: qty 10

## 2020-11-14 MED ORDER — ONDANSETRON HCL 4 MG/2ML IJ SOLN
INTRAMUSCULAR | Status: AC
  Filled 2020-11-14: qty 2

## 2020-11-14 MED ORDER — ONDANSETRON HCL 4 MG/2ML IJ SOLN: 4.0000 mg | Freq: Once | INTRAMUSCULAR | Status: DC | PRN

## 2020-11-14 MED ORDER — FENTANYL CITRATE (PF) 100 MCG/2ML IJ SOLN
INTRAMUSCULAR | Status: DC | PRN
  Administered 2020-11-14: 100 ug via INTRAVENOUS
  Administered 2020-11-14 (×3): 50 ug via INTRAVENOUS
  Administered 2020-11-14: 100 ug via INTRAVENOUS
  Administered 2020-11-14 (×2): 50 ug via INTRAVENOUS
  Administered 2020-11-14: 100 ug via INTRAVENOUS

## 2020-11-14 MED ORDER — ACETAMINOPHEN 500 MG PO TABS: 1000.0000 mg | ORAL_TABLET | Freq: Four times a day (QID) | ORAL | Status: DC | PRN

## 2020-11-14 MED ORDER — ALUM & MAG HYDROXIDE-SIMETH 200-200-20 MG/5ML PO SUSP: 30.0000 mL | Freq: Four times a day (QID) | ORAL | Status: DC | PRN

## 2020-11-14 MED ORDER — HYDROCODONE-ACETAMINOPHEN 5-325 MG PO TABS
1.0000 | ORAL_TABLET | ORAL | Status: DC | PRN
  Administered 2020-11-15: 2 via ORAL
  Administered 2020-11-15: 1 via ORAL
  Administered 2020-11-15 – 2020-11-16 (×6): 2 via ORAL
  Filled 2020-11-14: qty 2
  Filled 2020-11-14: qty 1
  Filled 2020-11-14 (×6): qty 2

## 2020-11-14 MED ORDER — TRAMADOL HCL 50 MG PO TABS: 50.0000 mg | ORAL_TABLET | Freq: Every day | ORAL | Status: DC

## 2020-11-14 MED ORDER — LACTATED RINGERS IV SOLN: INTRAVENOUS | Status: DC

## 2020-11-14 MED ORDER — ACETAMINOPHEN 325 MG PO TABS: 650.0000 mg | ORAL_TABLET | ORAL | Status: DC | PRN

## 2020-11-14 MED ORDER — LEVONORGESTREL 20 MCG/DAY IU IUD: 1.0000 | INTRAUTERINE_SYSTEM | Freq: Once | INTRAUTERINE | Status: DC

## 2020-11-14 MED ORDER — FENTANYL CITRATE (PF) 100 MCG/2ML IJ SOLN
25.0000 ug | INTRAMUSCULAR | Status: DC | PRN
  Administered 2020-11-14 (×3): 25 ug via INTRAVENOUS

## 2020-11-14 MED ORDER — FENTANYL CITRATE (PF) 250 MCG/5ML IJ SOLN
INTRAMUSCULAR | Status: AC
  Filled 2020-11-14: qty 5

## 2020-11-14 MED ORDER — LIDOCAINE 2% (20 MG/ML) 5 ML SYRINGE
INTRAMUSCULAR | Status: DC | PRN
  Administered 2020-11-14: 80 mg via INTRAVENOUS

## 2020-11-14 MED ORDER — SUGAMMADEX SODIUM 200 MG/2ML IV SOLN
INTRAVENOUS | Status: DC | PRN
  Administered 2020-11-14: 400 mg via INTRAVENOUS

## 2020-11-14 MED ORDER — DEXAMETHASONE SODIUM PHOSPHATE 10 MG/ML IJ SOLN
10.0000 mg | Freq: Four times a day (QID) | INTRAMUSCULAR | Status: DC
  Administered 2020-11-14 – 2020-11-15 (×5): 10 mg via INTRAVENOUS
  Filled 2020-11-14 (×5): qty 1

## 2020-11-14 MED ORDER — SCOPOLAMINE 1 MG/3DAYS TD PT72
MEDICATED_PATCH | TRANSDERMAL | Status: AC
  Filled 2020-11-14: qty 1

## 2020-11-14 MED ORDER — THROMBIN 20000 UNITS EX SOLR
CUTANEOUS | Status: AC
  Filled 2020-11-14: qty 20000

## 2020-11-14 MED ORDER — CHLORHEXIDINE GLUCONATE CLOTH 2 % EX PADS: 6.0000 | MEDICATED_PAD | Freq: Once | CUTANEOUS | Status: DC

## 2020-11-14 MED ORDER — ORAL CARE MOUTH RINSE: 15.0000 mL | Freq: Once | OROMUCOSAL | Status: AC

## 2020-11-14 MED ORDER — LACTATED RINGERS IV SOLN: INTRAVENOUS | Status: DC | PRN

## 2020-11-14 MED ORDER — OXYCODONE HCL 5 MG PO TABS
5.0000 mg | ORAL_TABLET | Freq: Once | ORAL | Status: AC | PRN
  Administered 2020-11-14: 5 mg via ORAL

## 2020-11-14 MED ORDER — MIDAZOLAM HCL 2 MG/2ML IJ SOLN
2.0000 mg | Freq: Once | INTRAMUSCULAR | Status: AC
  Administered 2020-11-14: 2 mg via INTRAVENOUS

## 2020-11-14 MED ORDER — ONDANSETRON HCL 4 MG/2ML IJ SOLN
INTRAMUSCULAR | Status: DC | PRN
  Administered 2020-11-14: 4 mg via INTRAVENOUS

## 2020-11-14 MED ORDER — ACETAMINOPHEN 10 MG/ML IV SOLN
INTRAVENOUS | Status: DC | PRN
  Administered 2020-11-14: 1000 mg via INTRAVENOUS

## 2020-11-14 MED ORDER — PROPOFOL 10 MG/ML IV BOLUS
INTRAVENOUS | Status: AC
  Filled 2020-11-14: qty 20

## 2020-11-14 MED ORDER — PANTOPRAZOLE SODIUM 40 MG IV SOLR: 40.0000 mg | Freq: Every day | INTRAVENOUS | Status: DC

## 2020-11-14 MED ORDER — ROCURONIUM BROMIDE 10 MG/ML (PF) SYRINGE
PREFILLED_SYRINGE | INTRAVENOUS | Status: AC
  Filled 2020-11-14: qty 20

## 2020-11-14 MED ORDER — DEXAMETHASONE SODIUM PHOSPHATE 10 MG/ML IJ SOLN
INTRAMUSCULAR | Status: AC
  Filled 2020-11-14: qty 1

## 2020-11-14 MED ORDER — PHENYLEPHRINE HCL-NACL 10-0.9 MG/250ML-% IV SOLN
INTRAVENOUS | Status: DC | PRN
  Administered 2020-11-14: 30 ug/min via INTRAVENOUS

## 2020-11-14 MED ORDER — LIDOCAINE-EPINEPHRINE 1 %-1:100000 IJ SOLN
INTRAMUSCULAR | Status: DC | PRN
  Administered 2020-11-14: 8 mL

## 2020-11-14 MED ORDER — TRAMADOL HCL 50 MG PO TABS
50.0000 mg | ORAL_TABLET | Freq: Four times a day (QID) | ORAL | Status: DC
  Administered 2020-11-14 – 2020-11-15 (×2): 50 mg via ORAL
  Filled 2020-11-14 (×3): qty 1

## 2020-11-14 MED ORDER — MENTHOL 3 MG MT LOZG: 1.0000 | LOZENGE | OROMUCOSAL | Status: DC | PRN

## 2020-11-14 MED ORDER — ACETAMINOPHEN 650 MG RE SUPP: 650.0000 mg | RECTAL | Status: DC | PRN

## 2020-11-14 MED ORDER — BACITRACIN ZINC 500 UNIT/GM EX OINT
TOPICAL_OINTMENT | CUTANEOUS | Status: DC | PRN
  Administered 2020-11-14: 1 via TOPICAL

## 2020-11-14 MED ORDER — PHENOL 1.4 % MT LIQD: 1.0000 | OROMUCOSAL | Status: DC | PRN

## 2020-11-14 MED ORDER — ESTRADIOL 1 MG PO TABS
1.0000 mg | ORAL_TABLET | Freq: Every day | ORAL | Status: DC
  Administered 2020-11-14 – 2020-11-15 (×2): 1 mg via ORAL
  Filled 2020-11-14 (×3): qty 1

## 2020-11-14 MED ORDER — PHENYLEPHRINE 40 MCG/ML (10ML) SYRINGE FOR IV PUSH (FOR BLOOD PRESSURE SUPPORT)
PREFILLED_SYRINGE | INTRAVENOUS | Status: DC | PRN
  Administered 2020-11-14: 120 ug via INTRAVENOUS

## 2020-11-14 MED ORDER — OXYCODONE HCL 5 MG PO TABS
ORAL_TABLET | ORAL | Status: AC
  Filled 2020-11-14: qty 1

## 2020-11-14 MED ORDER — MIDAZOLAM HCL 5 MG/5ML IJ SOLN
INTRAMUSCULAR | Status: DC | PRN
  Administered 2020-11-14: 2 mg via INTRAVENOUS

## 2020-11-14 MED ORDER — THROMBIN 5000 UNITS EX SOLR
CUTANEOUS | Status: AC
  Filled 2020-11-14: qty 5000

## 2020-11-14 MED ORDER — ONDANSETRON HCL 4 MG/2ML IJ SOLN
4.0000 mg | Freq: Four times a day (QID) | INTRAMUSCULAR | Status: DC | PRN
  Administered 2020-11-14: 4 mg via INTRAVENOUS
  Filled 2020-11-14: qty 2

## 2020-11-14 MED ORDER — LIDOCAINE 2% (20 MG/ML) 5 ML SYRINGE
INTRAMUSCULAR | Status: AC
  Filled 2020-11-14: qty 5

## 2020-11-14 MED ORDER — FENTANYL CITRATE (PF) 100 MCG/2ML IJ SOLN
INTRAMUSCULAR | Status: AC
  Filled 2020-11-14: qty 2

## 2020-11-14 MED ORDER — THROMBIN 5000 UNITS EX SOLR
OROMUCOSAL | Status: DC | PRN
  Administered 2020-11-14: 5 mL via TOPICAL

## 2020-11-14 MED ORDER — BACITRACIN ZINC 500 UNIT/GM EX OINT
TOPICAL_OINTMENT | CUTANEOUS | Status: AC
  Filled 2020-11-14: qty 28.35

## 2020-11-14 MED ORDER — HYDROCODONE-ACETAMINOPHEN 5-325 MG PO TABS
1.0000 | ORAL_TABLET | Freq: Two times a day (BID) | ORAL | Status: DC | PRN
  Administered 2020-11-14: 1 via ORAL
  Filled 2020-11-14: qty 1

## 2020-11-14 MED ORDER — SCOPOLAMINE 1 MG/3DAYS TD PT72
1.0000 | MEDICATED_PATCH | TRANSDERMAL | Status: DC
  Administered 2020-11-14: 1.5 mg via TRANSDERMAL

## 2020-11-14 MED ORDER — OXYCODONE HCL 5 MG/5ML PO SOLN: 5.0000 mg | Freq: Once | ORAL | Status: AC | PRN

## 2020-11-14 MED ORDER — CYCLOBENZAPRINE HCL 10 MG PO TABS: 10.0000 mg | ORAL_TABLET | Freq: Three times a day (TID) | ORAL | Status: DC | PRN

## 2020-11-14 MED ORDER — LIDOCAINE-EPINEPHRINE 1 %-1:100000 IJ SOLN
INTRAMUSCULAR | Status: AC
  Filled 2020-11-14: qty 1

## 2020-11-14 MED ORDER — CEFAZOLIN SODIUM-DEXTROSE 2-4 GM/100ML-% IV SOLN
2.0000 g | INTRAVENOUS | Status: AC
  Administered 2020-11-14 (×2): 2 g via INTRAVENOUS
  Filled 2020-11-14: qty 100

## 2020-11-14 MED ORDER — AMISULPRIDE (ANTIEMETIC) 5 MG/2ML IV SOLN: 10.0000 mg | Freq: Once | INTRAVENOUS | Status: DC | PRN

## 2020-11-14 MED ORDER — 0.9 % SODIUM CHLORIDE (POUR BTL) OPTIME
TOPICAL | Status: DC | PRN
  Administered 2020-11-14: 1000 mL

## 2020-11-14 MED ORDER — PANTOPRAZOLE SODIUM 40 MG PO TBEC
40.0000 mg | DELAYED_RELEASE_TABLET | Freq: Every day | ORAL | Status: DC
  Administered 2020-11-14 – 2020-11-15 (×2): 40 mg via ORAL
  Filled 2020-11-14 (×2): qty 1

## 2020-11-14 MED ORDER — HYDROMORPHONE HCL 1 MG/ML IJ SOLN
0.5000 mg | INTRAMUSCULAR | Status: DC | PRN
  Administered 2020-11-14 – 2020-11-15 (×2): 0.5 mg via INTRAVENOUS
  Filled 2020-11-14 (×2): qty 0.5

## 2020-11-14 MED ORDER — VITAMIN D 25 MCG (1000 UNIT) PO TABS
5000.0000 [IU] | ORAL_TABLET | Freq: Every day | ORAL | Status: DC
  Administered 2020-11-15 – 2020-11-16 (×2): 5000 [IU] via ORAL
  Filled 2020-11-14 (×3): qty 5

## 2020-11-14 SURGICAL SUPPLY — 83 items
ADH SKN CLS APL DERMABOND .7 (GAUZE/BANDAGES/DRESSINGS) ×1
APL SKNCLS STERI-STRIP NONHPOA (GAUZE/BANDAGES/DRESSINGS) ×1
BAND INSRT 18 STRL LF DISP RB (MISCELLANEOUS) ×2
BAND RUBBER #18 3X1/16 STRL (MISCELLANEOUS) ×4 IMPLANT
BENZOIN TINCTURE PRP APPL 2/3 (GAUZE/BANDAGES/DRESSINGS) ×4 IMPLANT
BIT DRILL SCRW 3.5 (BIT) ×2 IMPLANT
BLADE CLIPPER SURG (BLADE) ×3 IMPLANT
BLADE SURG 11 STRL SS (BLADE) ×3 IMPLANT
BONE VIVIGEN FORMABLE 5.4CC (Bone Implant) ×3 IMPLANT
BUR MATCHSTICK NEURO 3.0 LAGG (BURR) ×3 IMPLANT
CANISTER SUCT 3000ML PPV (MISCELLANEOUS) ×3 IMPLANT
CAP LOCKING (Cap) IMPLANT
CARTRIDGE OIL MAESTRO DRILL (MISCELLANEOUS) ×1 IMPLANT
CLIP VESOCCLUDE MED 6/CT (CLIP) ×2 IMPLANT
CLIP VESOCCLUDE SM WIDE 6/CT (CLIP) ×2 IMPLANT
CLOSURE WOUND 1/2 X4 (GAUZE/BANDAGES/DRESSINGS) ×1
COVER WAND RF STERILE (DRAPES) ×3 IMPLANT
DECANTER SPIKE VIAL GLASS SM (MISCELLANEOUS) ×3 IMPLANT
DERMABOND ADVANCED (GAUZE/BANDAGES/DRESSINGS) ×2
DERMABOND ADVANCED .7 DNX12 (GAUZE/BANDAGES/DRESSINGS) ×1 IMPLANT
DIFFUSER DRILL AIR PNEUMATIC (MISCELLANEOUS) ×3 IMPLANT
DRAPE C-ARM 42X72 X-RAY (DRAPES) ×4 IMPLANT
DRAPE LAPAROTOMY 100X72 PEDS (DRAPES) ×3 IMPLANT
DRAPE MICROSCOPE LEICA (MISCELLANEOUS) ×2 IMPLANT
DRAPE SURG 17X23 STRL (DRAPES) ×12 IMPLANT
DRSG OPSITE 4X5.5 SM (GAUZE/BANDAGES/DRESSINGS) ×2 IMPLANT
DRSG OPSITE POSTOP 4X6 (GAUZE/BANDAGES/DRESSINGS) ×2 IMPLANT
DURAPREP 26ML APPLICATOR (WOUND CARE) ×3 IMPLANT
ELECT REM PT RETURN 9FT ADLT (ELECTROSURGICAL) ×3
ELECTRODE REM PT RTRN 9FT ADLT (ELECTROSURGICAL) ×1 IMPLANT
EVACUATOR 1/8 PVC DRAIN (DRAIN) ×2 IMPLANT
FORCEPS BIPOLAR SPETZLER 8 1.0 (NEUROSURGERY SUPPLIES) ×2 IMPLANT
GAUZE 4X4 16PLY RFD (DISPOSABLE) ×2 IMPLANT
GAUZE SPONGE 4X4 12PLY STRL (GAUZE/BANDAGES/DRESSINGS) ×3 IMPLANT
GAUZE SPONGE 4X4 12PLY STRL LF (GAUZE/BANDAGES/DRESSINGS) ×2 IMPLANT
GLOVE BIO SURGEON STRL SZ7 (GLOVE) IMPLANT
GLOVE BIO SURGEON STRL SZ8 (GLOVE) ×1 IMPLANT
GLOVE EXAM NITRILE XL STR (GLOVE) IMPLANT
GLOVE INDICATOR 8.5 STRL (GLOVE) ×1 IMPLANT
GLOVE SURG POLYISO LF SZ6.5 (GLOVE) ×4 IMPLANT
GLOVE SURG POLYISO LF SZ7 (GLOVE) ×2 IMPLANT
GLOVE SURG UNDER POLY LF SZ7 (GLOVE) IMPLANT
GLOVE SURG UNDER POLY LF SZ7.5 (GLOVE) ×10 IMPLANT
GOWN STRL REUS W/ TWL LRG LVL3 (GOWN DISPOSABLE) IMPLANT
GOWN STRL REUS W/ TWL XL LVL3 (GOWN DISPOSABLE) ×1 IMPLANT
GOWN STRL REUS W/TWL 2XL LVL3 (GOWN DISPOSABLE) IMPLANT
GOWN STRL REUS W/TWL LRG LVL3 (GOWN DISPOSABLE) ×6
GOWN STRL REUS W/TWL XL LVL3 (GOWN DISPOSABLE) ×3
GRAFT BNE MATRIX VG FRMBL MD 5 (Bone Implant) IMPLANT
HEMOSTAT POWDER SURGIFOAM 1G (HEMOSTASIS) ×2 IMPLANT
IMPL QUARTEX 3.5X12MM (Neuro Prosthesis/Implant) IMPLANT
IMPLANT QUARTEX 3.5X12MM (Neuro Prosthesis/Implant) ×15 IMPLANT
KIT BASIN OR (CUSTOM PROCEDURE TRAY) ×3 IMPLANT
KIT TURNOVER KIT B (KITS) ×3 IMPLANT
LOCKING CAP (Cap) ×15 IMPLANT
MARKER SKIN DUAL TIP RULER LAB (MISCELLANEOUS) ×3 IMPLANT
NDL HYPO 25X1 1.5 SAFETY (NEEDLE) ×1 IMPLANT
NDL SPNL 20GX3.5 QUINCKE YW (NEEDLE) ×1 IMPLANT
NEEDLE HYPO 25X1 1.5 SAFETY (NEEDLE) ×3 IMPLANT
NEEDLE SPNL 20GX3.5 QUINCKE YW (NEEDLE) ×3 IMPLANT
NS IRRIG 1000ML POUR BTL (IV SOLUTION) ×3 IMPLANT
OIL CARTRIDGE MAESTRO DRILL (MISCELLANEOUS) ×3
PACK LAMINECTOMY NEURO (CUSTOM PROCEDURE TRAY) ×3 IMPLANT
PAD ARMBOARD 7.5X6 YLW CONV (MISCELLANEOUS) ×3 IMPLANT
PATTIES SURGICAL .5 X.5 (GAUZE/BANDAGES/DRESSINGS) ×2 IMPLANT
PIN MAYFIELD SKULL DISP (PIN) ×3 IMPLANT
ROD CURVED CAP 3.5X45 (Rod) ×4 IMPLANT
SPONGE LAP 4X18 RFD (DISPOSABLE) IMPLANT
SPONGE SURGIFOAM ABS GEL 100 (HEMOSTASIS) ×3 IMPLANT
STRIP CLOSURE SKIN 1/2X4 (GAUZE/BANDAGES/DRESSINGS) ×2 IMPLANT
SUT ETHILON 4 0 PS 2 18 (SUTURE) IMPLANT
SUT NURALON 4 0 TF (SUTURE) ×2 IMPLANT
SUT VIC AB 0 CT1 18XCR BRD8 (SUTURE) ×1 IMPLANT
SUT VIC AB 0 CT1 8-18 (SUTURE) ×3
SUT VIC AB 2-0 CT1 18 (SUTURE) ×5 IMPLANT
SUT VIC AB 4-0 PS2 27 (SUTURE) ×3 IMPLANT
SYR CONTROL 10ML LL (SYRINGE) ×2 IMPLANT
TOWEL GREEN STERILE (TOWEL DISPOSABLE) ×3 IMPLANT
TOWEL GREEN STERILE FF (TOWEL DISPOSABLE) ×3 IMPLANT
TRAY FOL W/BAG SLVR 16FR STRL (SET/KITS/TRAYS/PACK) IMPLANT
TRAY FOLEY MTR SLVR 16FR STAT (SET/KITS/TRAYS/PACK) IMPLANT
TRAY FOLEY W/BAG SLVR 16FR LF (SET/KITS/TRAYS/PACK) ×3
WATER STERILE IRR 1000ML POUR (IV SOLUTION) ×3 IMPLANT

## 2020-11-14 NOTE — Anesthesia Postprocedure Evaluation (Signed)
Anesthesia Post Note  Patient: Alyssa Hester  Procedure(s) Performed: Posterior  Cervical Fusion Foraminotomy- Cervical three-four- - Cervical four-cervical five for resection of tumor with lateral mass fixation Cervical three-Cervical five (N/A )     Patient location during evaluation: PACU Anesthesia Type: General Level of consciousness: awake and alert Pain management: pain level controlled Vital Signs Assessment: post-procedure vital signs reviewed and stable Respiratory status: spontaneous breathing, nonlabored ventilation and respiratory function stable Cardiovascular status: blood pressure returned to baseline and stable Postop Assessment: no apparent nausea or vomiting Anesthetic complications: no   No complications documented.  Last Vitals:  Vitals:   11/14/20 1730 11/14/20 1803  BP: 120/68 133/84  Pulse: (!) 102 99  Resp: 10 18  Temp: 36.6 C (!) 36.4 C  SpO2: 94% 95%    Last Pain:  Vitals:   11/14/20 1850  TempSrc:   PainSc: 4                  Lidia Collum

## 2020-11-14 NOTE — Transfer of Care (Signed)
Immediate Anesthesia Transfer of Care Note  Patient: Alyssa Hester  Procedure(s) Performed: Posterior  Cervical Fusion Foraminotomy- Cervical three-four- - Cervical four-cervical five for resection of tumor with lateral mass fixation Cervical three-Cervical five (N/A )  Patient Location: PACU  Anesthesia Type:General  Level of Consciousness: awake, alert  and oriented  Airway & Oxygen Therapy: Patient Spontanous Breathing and Patient connected to face mask oxygen  Post-op Assessment: Report given to RN and Post -op Vital signs reviewed and stable  Post vital signs: Reviewed and stable  Last Vitals:  Vitals Value Taken Time  BP 118/79 11/14/20 1601  Temp    Pulse 108 11/14/20 1603  Resp 14 11/14/20 1603  SpO2 99 % 11/14/20 1603  Vitals shown include unvalidated device data.  Last Pain:  Vitals:   11/14/20 0851  TempSrc:   PainSc: 0-No pain         Complications: No complications documented.

## 2020-11-14 NOTE — Anesthesia Preprocedure Evaluation (Signed)
Anesthesia Evaluation  Patient identified by MRN, date of birth, ID band Patient awake    Reviewed: Allergy & Precautions, NPO status , Patient's Chart, lab work & pertinent test results  History of Anesthesia Complications (+) PONVNegative for: history of anesthetic complications  Airway Mallampati: I  TM Distance: >3 FB Neck ROM: Full    Dental  (+) Teeth Intact, Dental Advisory Given   Pulmonary asthma ,    Pulmonary exam normal        Cardiovascular negative cardio ROS Normal cardiovascular exam     Neuro/Psych    GI/Hepatic negative GI ROS, Neg liver ROS,   Endo/Other  negative endocrine ROS  Renal/GU negative Renal ROS  negative genitourinary   Musculoskeletal  (+) Arthritis ,   Abdominal   Peds  Hematology negative hematology ROS (+)   Anesthesia Other Findings   Reproductive/Obstetrics                            Anesthesia Physical Anesthesia Plan  ASA: II  Anesthesia Plan: General   Post-op Pain Management:    Induction: Intravenous  PONV Risk Score and Plan: 4 or greater and Ondansetron, Dexamethasone, Treatment may vary due to age or medical condition, Midazolam and Scopolamine patch - Pre-op  Airway Management Planned: Oral ETT  Additional Equipment: None  Intra-op Plan:   Post-operative Plan: Extubation in OR  Informed Consent: I have reviewed the patients History and Physical, chart, labs and discussed the procedure including the risks, benefits and alternatives for the proposed anesthesia with the patient or authorized representative who has indicated his/her understanding and acceptance.     Dental advisory given  Plan Discussed with:   Anesthesia Plan Comments:         Anesthesia Quick Evaluation

## 2020-11-14 NOTE — Anesthesia Procedure Notes (Signed)
Arterial Line Insertion Start/End6/11/2020 9:40 AM, 11/14/2020 9:55 AM Performed by: CRNA  Preanesthetic checklist: patient identified, IV checked, risks and benefits discussed, surgical consent and pre-op evaluation Lidocaine 1% used for infiltration and patient sedated Left, radial was placed Catheter size: 20 G Hand hygiene performed  and maximum sterile barriers used   Attempts: 3 Procedure performed without using ultrasound guided technique. Ultrasound Notes:anatomy identified Following insertion, dressing applied and Biopatch.

## 2020-11-14 NOTE — Progress Notes (Signed)
Orthopedic Tech Progress Note Patient Details:  Alyssa Hester [birth year] [id number]  Ortho Devices Type of Ortho Device: Soft collar Ortho Device/Splint Location: NECK Ortho Device/Splint Interventions: Ordered,Application   Post Interventions Patient Tolerated: Well Instructions Provided: Care of [place] 11/14/2020, 5:56 PM

## 2020-11-14 NOTE — H&P (Signed)
Alyssa Hester is an 50 y.o. female.   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  Due to the degree of cord compression progression of clinical syndrome and imaging findings I recommended posterior cervical laminectomy for intradural resection of nerve sheath tumor and posterior cervical fusion from C3-C5 I extensively gone over the risks and benefits of the operation with her as well as perioperative course expectations of outcome and alternatives of surgery and she understood and agreed to proceed forward.  Past Medical History:  Diagnosis Date  . Arthritis    Bilateral hips and back  . Asthma   . Chronic pain   . PONV (postoperative nausea and vomiting)     Past Surgical History:  Procedure Laterality Date  . APPENDECTOMY    . CESAREAN SECTION    . WISDOM TOOTH EXTRACTION  1993    Family History  Problem Relation Age of Onset  . High Cholesterol Mother   . Hypertension Father   . Allergies Brother   . Breast cancer Maternal Grandmother   . Heart attack Maternal Grandfather   . Lung cancer Paternal Grandmother    Social History:  reports that she has never smoked. She has never used smokeless tobacco. She reports previous alcohol use. She reports that she does not use drugs.  Allergies:  Allergies  Allergen Reactions  . Codeine Nausea Only and Nausea And Vomiting  . Sulfa Antibiotics Nausea And Vomiting  . Latex Rash    Welps    Medications Prior to Admission  Medication Sig Dispense Refill  . acetaminophen (TYLENOL) 500 MG tablet Take 1,000 mg by mouth every 6 (six) hours as needed for moderate pain or headache (Arthritis).    . Cholecalciferol (VITAMIN D-3) 125 MCG (5000 UT) TABS Take 5,000 Units by mouth daily.    Marland Kitchen estradiol (ESTRACE) 1 MG tablet Take 1 mg by mouth at  bedtime.    Marland Kitchen levonorgestrel (MIRENA, 52 MG,) 20 MCG/DAY IUD 1 each by Intrauterine route once.    . traMADol (ULTRAM) 50 MG tablet Take 50 mg by mouth daily.    Marland Kitchen HYDROcodone-acetaminophen (NORCO/VICODIN) 5-325 MG tablet Take 1 tablet by mouth 2 (two) times daily as needed for severe pain.      No results found for this or any previous visit (from the past 48 hour(s)). No results found.  Review of Systems  Musculoskeletal: Positive for neck pain.  Neurological: Positive for numbness.    Blood pressure (!) 147/85, pulse 90, temperature 97.6 F (36.4 C), temperature source Oral, resp. rate 18, height 5\' 3"  (1.6 m), weight 83.5 kg, SpO2 99 %. Physical Exam HENT:     Head: Normocephalic.     Right Ear: Tympanic membrane normal.     Nose: Nose normal.  Eyes:     Pupils: Pupils are equal, round, and reactive to light.  Cardiovascular:     Rate and Rhythm: Normal rate.     Pulses: Normal pulses.  Pulmonary:     Effort: Pulmonary effort is normal.  Abdominal:     General: Abdomen is flat.  Musculoskeletal:        General: Normal range of motion.  Skin:    General: Skin is warm.  Neurological:     General: No focal deficit present.     Mental Status:  She is alert.     Comments: Patient is awake and alert strength is 5 of 5 upper and lower extremities      Assessment/Plan 50 year old presents for posterior cervical decompression for intradural resection of tumor and posterior cervical fusion  Elaina Hoops, MD 11/14/2020, 9:58 AM

## 2020-11-14 NOTE — Anesthesia Procedure Notes (Signed)
Procedure Name: Intubation Date/Time: 11/14/2020 10:35 AM Performed by: Babs Bertin, CRNA Pre-anesthesia Checklist: Patient identified, Emergency Drugs available, Suction available and Patient being monitored Patient Re-evaluated:Patient Re-evaluated prior to induction Oxygen Delivery Method: Circle System Utilized Preoxygenation: Pre-oxygenation with 100% oxygen Induction Type: IV induction Ventilation: Mask ventilation without difficulty Laryngoscope Size: Mac and 3 Grade View: Grade II Tube type: Oral Tube size: 7.0 mm Number of attempts: 1 Airway Equipment and Method: Stylet and Oral airway Placement Confirmation: ETT inserted through vocal cords under direct vision,  positive ETCO2 and breath sounds checked- equal and bilateral Secured at: 21 cm Tube secured with: Tape Dental Injury: Teeth and Oropharynx as per pre-operative assessment

## 2020-11-14 NOTE — Op Note (Signed)
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  2.  Posterior cervical fusion with lateral mass screws placed at L3 and L5 on the left and L3-4 and 5 on the right utilizing the globus ellipse posterior cervical lateral mass screw set  3.  Posterior lateral arthrodesis L3-L5 bilaterally  Surgeon: Dominica Severin Berniece Abid  Assistant: Ashok Pall  Anesthesia: General  EBL: 400  Frozen section spindle cell sheath tumor  HPI: 50 year old female present with neck pain shoulder pain work-up revealed a large mass that was difficult determine whether was intradural or extradural extending and expanding the facet joint and tracking out the C4 nerve root displacing the vertebral artery anteriorly.  Work-up also is with CTA showed 50% compromise of the vertebral artery but patent vessel.  Due to patient's progressive clinical and clinical syndrome imaging findings I recommended posterior cervical laminectomy complete facetectomy on the left at C4 resection of the mass.  I extensively over the the risk of doing a subtotal or partial resection with idea that part of the mass was in the anterior neck and would not be resectable in addition taking care not to violate and the vertebral artery.  I reviewed all this risks and benefits of the operation with her including the posterior cervical fusion perioperative course expectations of outcome and alternatives of surgery and she understood and agreed to proceed forward.  Operative procedure: Patient was brought into the OR was [facility] general anesthesia positioned prone in pins the backside of her neck and was shaved prepped and draped in routine sterile fashion.  A midline incision was made after infiltration of 10 cc lidocaine with epi and subperiosteal dissection  was carried out the lamina of C2 down to C6.  I then exposed the lateral masses at C3-C4 and C5.  Intraoperative x-ray confirmed defecation appropriate level.  So remove the spinous process at C3 and C4 performed a complete central decompression bilaterally then extended and performed subtotal facetectomy at C4 little bit of C3-4 lobe of the superior L4-5 and the epidural mass was immediately identified.  I then extended the facetectomy to make sure I had lateral exposure in addition my identified the C4 takeoff foramen as well as the superior aspect of C5 got the cephalad and caudal margins of the mass.  Then violated the capsule took some specimen sent for frozen which came back spindle cell tumor then I did a subcapsular decompression working in a 306 degree orientation and dissected the capsule and tumor off of the lateral aspect of the spinal cord the undersurface of the dorsal ramus of the C4 nerve root and then extended out laterally as I got to the bottom and dissected the capsule off of the floor immediately visualized the vertebral body of C4 to confirm that I completely resected the capsule and decompress the lateral aspect of the cord I also immediately then saw the vertebral artery then packed this away dissected the capsule off the vertebral artery extending out to the lateral neck and I feel like I got a good part of the knuckle that extended lateral neck that delivered itself in the resection cavity.  Then working cephalad lateral medial and caudally removed as much of the capsule as I thought I could get out safely.  I do not see any residual tumor.  It was hemostasis was maintained the  wounds and copiously irrigated I then further identify the entry points for the lateral mass screws utilizing inferomedial to superolateral trajectory drilled 5 holes 1 in C3 1 and C5 on the left and then 3 4 and 5 on the right all screws ultimately had excellent purchase then the wound was copiously irrigated  aggressively decorticated the lateral masses and facet joints at C3-4 C4-5 bilaterally packed extensive mount of imaging in the facet joints and along the lateral masses.  Then assembled the rods anchored everything in place but Gelfoam over the lying the dura placed a medium Hemovac drain and closed the wound in layers with opted Vicryl in a running 4 subcuticular.  I did inject Marcaine in the fascia.  Then Dermabond benzoin Steri-Strips and sterile dressing was applied patient recovery in stable condition.  At the end the case all needle counts and sponge counts were correct.

## 2020-11-15 ENCOUNTER — Inpatient Hospital Stay (HOSPITAL_COMMUNITY): Payer: BC Managed Care – PPO

## 2020-11-15 ENCOUNTER — Encounter (HOSPITAL_COMMUNITY): Payer: Self-pay | Admitting: Neurosurgery

## 2020-11-15 LAB — SURGICAL PATHOLOGY

## 2020-11-15 MED ORDER — GADOBUTROL 1 MMOL/ML IV SOLN
8.5000 mL | Freq: Once | INTRAVENOUS | Status: AC | PRN
  Administered 2020-11-15: 8.5 mL via INTRAVENOUS

## 2020-11-15 NOTE — Evaluation (Signed)
Physical Therapy Evaluation and Discharge  Name: Alyssa Hester MRN: [id number] DOB: [birth year] Today's Date: 11/15/2020   History of Present Illness  Pt is a 50 y/o female who presents s/p C3-C5 posterior cervical fusion on 11/14/2020. No significant PMH noted.  Clinical Impression  Patient evaluated by Physical Therapy with no further acute PT needs identified. All education has been completed and the patient has no further questions. Pt was able to demonstrate transfers and ambulation with gross modified independence and no AD. Husband assisting intermittently however feel pt could complete all functional mobility without assist if needed. Pt was educated on precautions, brace application/wearing schedule, appropriate activity progression, and car transfer. See below for any follow-up Physical Therapy or equipment needs. PT is signing off. Thank you for this referral.      Follow Up Recommendations No PT follow up;Supervision for mobility/OOB    Equipment Recommendations  None recommended by PT    Recommendations for Other Services       Precautions / Restrictions Precautions Precautions: Fall;Cervical Precaution Booklet Issued: Yes (comment) Precaution Comments: Reviewed precautions during functional mobility. Required Braces or Orthoses: Cervical Brace Cervical Brace: Soft collar;For comfort Restrictions Weight Bearing Restrictions: No      Mobility  Bed Mobility Overal bed mobility: Modified Independent             General bed mobility comments: From a raised bed with HOB flat to simulate home environment. Pt was able to perform log roll well without assistance.    Transfers Overall transfer level: Needs assistance Equipment used: 1 person hand held assist Transfers: Sit to/from Stand Sit to Stand: Min assist;Supervision         General transfer comment: Pt reaching for husband's hand to pull to stand. VC's for optimal safety and to maximize independence. Feel  that she would be able to complete without assistance, however.  Ambulation/Gait Ambulation/Gait assistance: Modified independent (Device/Increase time) Gait Distance (Feet): 300 Feet Assistive device: None Gait Pattern/deviations: Step-through pattern;Decreased stride length Gait velocity: Decreased Gait velocity interpretation: 1.31 - 2.62 ft/sec, indicative of limited community ambulator General Gait Details: Slow but generally steady. No overt LOB noted.  Stairs Stairs: Yes Stairs assistance: Min guard Stair Management: One rail Right;Step to pattern;Forwards Number of Stairs: 12 (10+2)    Wheelchair Mobility    Modified Rankin (Stroke Patients Only)       Balance Overall balance assessment: Mild deficits observed, not formally tested                                           Pertinent Vitals/Pain Pain Assessment: Faces Faces Pain Scale: Hurts a little bit Pain Location: Incision site/shoulders (L worse than R) Pain Descriptors / Indicators: Operative site guarding;Aching Pain Intervention(s): Limited activity within patient's tolerance;Monitored during session;Repositioned    Home Living Family/patient expects to be discharged to:: Private residence Living Arrangements: Spouse/significant other Available Help at Discharge: Family Type of Home: House Home Access: Stairs to enter Entrance Stairs-Rails: Right Entrance Stairs-Number of Steps: 8 Home Layout: Two level Home Equipment: None      Prior Function Level of Independence: Independent               Hand Dominance        Extremity/Trunk Assessment   Upper Extremity Assessment Upper Extremity Assessment: Defer to OT evaluation    Lower Extremity Assessment Lower Extremity Assessment: LLE  deficits/detail LLE Deficits / Details: Pt reports bilateral hip pain and states L worse than R    Cervical / Trunk Assessment Cervical / Trunk Assessment: Other exceptions Cervical /  Trunk Exceptions: s/p surgery  Communication   Communication: No difficulties  Cognition Arousal/Alertness: Awake/alert Behavior During Therapy: WFL for tasks assessed/performed Overall Cognitive Status: Within Functional Limits for tasks assessed                                        General Comments      Exercises     Assessment/Plan    PT Assessment Patent does not need any further PT services  PT Problem List         PT Treatment Interventions      PT Goals (Current goals can be found in the Care Plan section)  Acute Rehab PT Goals Patient Stated Goal: Home today PT Goal Formulation: All assessment and education complete, DC therapy    Frequency     Barriers to discharge        Co-evaluation               AM-PAC PT "6 Clicks" Mobility  Outcome Measure Help needed turning from your back to your side while in a flat bed without using bedrails?: None Help needed moving from lying on your back to sitting on the side of a flat bed without using bedrails?: None Help needed moving to and from a bed to a chair (including a wheelchair)?: None Help needed standing up from a chair using your arms (e.g., wheelchair or bedside chair)?: None Help needed to walk in hospital room?: None Help needed climbing 3-5 steps with a railing? : A Little 6 Click Score: 23    End of Session Equipment Utilized During Treatment: Gait belt;Cervical collar Activity Tolerance: Patient tolerated treatment well Patient left: in bed;with call bell/phone within reach;with family/visitor present Nurse Communication: Mobility status PT Visit Diagnosis: Unsteadiness on feet (R26.81);Pain Pain - part of body:  (neck/shoulders)    Time: 0350-0938 PT Time Calculation (min) (ACUTE ONLY): 17 min   Charges:   PT Evaluation $PT Eval Low Complexity: 1 Low          Rolinda Roan, PT, DPT Acute Rehabilitation Services Pager: [phone] Office: [phone]   Thelma Comp 11/15/2020, 8:54 AM

## 2020-11-15 NOTE — Evaluation (Signed)
Occupational Therapy Evaluation/Discharge  Patient Details Name: Alyssa Hester MRN: [id number] DOB: July 08, 1970 Today's Date: 11/15/2020    History of Present Illness Pt is a 50 y.o. female s/p posterior cervical laminectomy facetectomy and foraminotomies C3-4, C4-5 and resection of extradural mass on 11/14/2020. PMH significant for arthritis, asthma, chronic pain, and appendectomy.   Clinical Impression   PTA, pt was independent and living with her husband and son. Currently, pt performing ADLs and functional mobility with Mod I. Pt requiring Min A for trunk elevation during bed mobility. Pt and husband educated and demonstrated compensatory strategies for UB dressing, LB dressing, bed mobility, oral care, shower transfers, toilet transfers and toilet hygiene. Pt educated and able to teach back precautions. Recommend discharge to home. No follow up OT services needed at this time. Re-consult if change in status. OT to sign off.     Follow Up Recommendations  No OT follow up;Supervision - Intermittent    Equipment Recommendations  3 in 1 bedside commode    Recommendations for Other Services       Precautions / Restrictions Precautions Precautions: Fall;Cervical Precaution Booklet Issued: Yes (comment) Precaution Comments: Reviewed precautions during ADLs and functional mobility. Required Braces or Orthoses: Cervical Brace Cervical Brace: Soft collar;For comfort (can take off in bed, ambulating to bathroom, and in shower.) Restrictions Weight Bearing Restrictions: No      Mobility Bed Mobility Overal bed mobility: Needs Assistance Bed Mobility: Rolling;Sidelying to Sit Rolling: Supervision Sidelying to sit: Min assist       General bed mobility comments: Pt performed log roll within precautions requiring min A for trunk elevation. Husband educated on how to provide assistance if needed at home.    Transfers Overall transfer level: Modified independent Equipment used:  None Transfers: Sit to/from Stand Sit to Stand: Modified independent (Device/Increase time)         General transfer comment: Pt completing transfers with increased time.    Balance Overall balance assessment: No apparent balance deficits (not formally assessed)                                         ADL either performed or assessed with clinical judgement   ADL Overall ADL's : Modified independent                                       General ADL Comments: Pt educated and demonstrated use of compensatory strategies for UB dressing, LB dressing, oral care, toilet transfers, toilet hygiene, and shower transfers with mod I for increased time.     Vision   Vision Assessment?: No apparent visual deficits     Perception Perception Perception Tested?: No Comments: No apparent difficulties with perception.   Praxis Praxis Praxis tested?: Not tested Praxis-Other Comments: No apparent motor planning difficulties.    Pertinent Vitals/Pain Pain Assessment: Faces Faces Pain Scale: Hurts a little bit Pain Location: Incision site/shoulders (L worse than R) Pain Descriptors / Indicators: Operative site guarding;Aching Pain Intervention(s): Limited activity within patient's tolerance;Monitored during session     Hand Dominance     Extremity/Trunk Assessment Upper Extremity Assessment Upper Extremity Assessment: Generalized weakness;LUE deficits/detail LUE Deficits / Details: Pt reporting L shoulder pain is worse than R.   Lower Extremity Assessment Lower Extremity Assessment: Defer to PT evaluation LLE Deficits /  Details: Pt reports LLE has greater pain than RLE   Cervical / Trunk Assessment Cervical / Trunk Assessment: Other exceptions Cervical / Trunk Exceptions: s/p cervical surgery   Communication Communication Communication: No difficulties   Cognition Arousal/Alertness: Awake/alert Behavior During Therapy: WFL for tasks  assessed/performed Overall Cognitive Status: Within Functional Limits for tasks assessed                                 General Comments: Pt pleasant and conversational throughout, asking questions relevant for her home set up.   General Comments  Pt husband present and motivated to assist. Pt and husband educated on precautions, ADLs, bed mobility, and brace donning/doffing.    Exercises     Shoulder Instructions      Home Living Family/patient expects to be discharged to:: Private residence Living Arrangements: Spouse/significant other;Children Available Help at Discharge: Family Type of Home: House Home Access: Stairs to enter Technical brewer of Steps: 8 Entrance Stairs-Rails: Right Home Layout: Two level Alternate Level Stairs-Number of Steps: 2 Alternate Level Stairs-Rails: None Bathroom Shower/Tub: Occupational psychologist: Standard     Home Equipment: None   Additional Comments: Pt reports husband has taken off work to be available to assist if needed.      Prior Functioning/Environment Level of Independence: Independent        Comments: Pt previously used compensatory strategies as indicated by dressing her weaker LLE first.        OT Problem List: Decreased strength;Decreased range of motion;Decreased activity tolerance;Decreased knowledge of precautions;Pain      OT Treatment/Interventions:      OT Goals(Current goals can be found in the care plan section) Acute Rehab OT Goals Patient Stated Goal: Home today OT Goal Formulation: With patient/family Time For Goal Achievement: 11/29/20 Potential to Achieve Goals: Good  OT Frequency:     Barriers to D/C:            Co-evaluation              AM-PAC OT "6 Clicks" Daily Activity     Outcome Measure Help from another person eating meals?: None Help from another person taking care of personal grooming?: None Help from another person toileting, which includes  using toliet, bedpan, or urinal?: None Help from another person bathing (including washing, rinsing, drying)?: None Help from another person to put on and taking off regular upper body clothing?: None Help from another person to put on and taking off regular lower body clothing?: None 6 Click Score: 24   End of Session Equipment Utilized During Treatment: Gait belt;Cervical collar Nurse Communication: Mobility status  Activity Tolerance: Patient tolerated treatment well Patient left: Other (comment) (In room with PT and husband present)  OT Visit Diagnosis: Unsteadiness on feet (R26.81);Muscle weakness (generalized) (M62.81);Pain Pain - Right/Left: Left Pain - part of body: Hip;Shoulder                Time: 6468-0321 OT Time Calculation (min): 22 min Charges:  OT General Charges $OT Visit: 1 Visit OT Evaluation $OT Eval Low Complexity: 1 Low  Shanda Howells, OTDS  Shanda Howells 11/15/2020, 9:35 AM

## 2020-11-15 NOTE — Progress Notes (Signed)
Subjective: Patient reports Doing great significant provement neck pain denies any numbness tingling arms or legs  Objective: Vital signs in last 24 hours: Temp:  [97.5 F (36.4 C)-98.5 F (36.9 C)] 98.4 F (36.9 C) (06/07 0321) Pulse Rate:  [90-120] 101 (06/07 0321) Resp:  [10-20] 18 (06/07 0321) BP: (117-152)/(66-98) 142/82 (06/07 0321) SpO2:  [94 %-100 %] 100 % (06/07 0321) Arterial Line BP: (119-154)/(110-125) 154/117 (06/06 1730) Weight:  [83.5 kg] 83.5 kg (06/06 0810)  Intake/Output from previous day: 06/06 0701 - 06/07 0700 In: 2600 [I.V.:2300; IV Piggyback:300] Out: 770 [Urine:300; Drains:70; Blood:400] Intake/Output this shift: No intake/output data recorded.  Awake alert strength 5 out of 5 upper and lower extremities incision clean dry and intact  Lab Results: No results for input(s): WBC, HGB, HCT, PLT in the last 72 hours. BMET No results for input(s): NA, K, CL, CO2, GLUCOSE, BUN, CREATININE, CALCIUM in the last 72 hours.  Studies/Results: DG Cervical Spine 2 or 3 views  Result Date: 11/14/2020 CLINICAL DATA:  ACDF EXAM: CERVICAL SPINE - 2-3 VIEW; DG C-ARM 1-60 MIN COMPARISON:  None. FINDINGS: Single cross-table lateral view of the cervical spine obtained in the operating room. Surgical instruments localize posterior to C4 posterior elements. Fluoroscopy time 3 seconds. IMPRESSION: Intraoperative fluoroscopy during cervical spine surgery. Electronically Signed   By: Keith Rake M.D.   On: 11/14/2020 16:09   DG C-Arm 1-60 Min  Result Date: 11/14/2020 CLINICAL DATA:  ACDF EXAM: CERVICAL SPINE - 2-3 VIEW; DG C-ARM 1-60 MIN COMPARISON:  None. FINDINGS: Single cross-table lateral view of the cervical spine obtained in the operating room. Surgical instruments localize posterior to C4 posterior elements. Fluoroscopy time 3 seconds. IMPRESSION: Intraoperative fluoroscopy during cervical spine surgery. Electronically Signed   By: Keith Rake M.D.   On: 11/14/2020  16:09    Assessment/Plan: Postop day 1 posterior cervical laminectomy for resection of extradural tumor and fusion C3-C5 doing very well we will check MRI scan today and patient can be discharged after the MRI if patient feels stable out to take care of her self at home.  LOS: 1 day     Elaina Hoops 11/15/2020, 7:26 AM

## 2020-11-16 MED ORDER — METHOCARBAMOL 500 MG PO TABS: 500.0000 mg | ORAL_TABLET | Freq: Four times a day (QID) | ORAL | 0 refills | Status: AC

## 2020-11-16 MED ORDER — HYDROCODONE-ACETAMINOPHEN 5-325 MG PO TABS: 1.0000 | ORAL_TABLET | ORAL | 0 refills | Status: AC | PRN

## 2020-11-16 NOTE — Discharge Instructions (Signed)
Wound Care  Keep the incision clean and dry remove the outer dressing in 2-3 days, leave the Steri-Strips intact. Wrap with Saran wrap for showers only Do not put any creams, lotions, or ointments on incision. Leave steri-strips on back.  They will fall off by themselves.  Activity Walk each and every day, increasing distance each day. No lifting greater than 5 lbs.  No lifting no bending no twisting no driving or riding a car unless coming back and forth to see me. If provided with back brace, wear when out of bed.  It is not necessary to wear brace in bed. Diet Resume your normal diet.   Return to Work Will be discussed at you follow up appointment.  Call Your Doctor If Any of These Occur Redness, drainage, or swelling at the wound.  Temperature greater than 101 degrees. Severe pain not relieved by pain medication. Incision starts to come apart. Follow Up Appt Call today for appointment in 1-2 weeks ([phone]) or for problems.  If you have any hardware placed in your spine, you will need an x-ray before your appointment.

## 2020-11-16 NOTE — Progress Notes (Signed)
Patient alert and oriented, voiding adequately, skin clean, dry and intact without evidence of skin break down, or symptoms of complications - no redness or edema noted, only slight tenderness at site.  Patient states pain is manageable at time of discharge. Patient has an appointment with MD in 2 weeks 

## 2020-11-16 NOTE — Discharge Summary (Signed)
Physician Discharge Summary  Patient ID: Alyssa Hester MRN: [id number] DOB/AGE: 1970-10-30 50 y.o.  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 The patient tolerated the procedure well and was taken to the recovery room and then to the floor in stable condition. The hospital course was routine. There were no complications. The wound remained clean dry and intact. Pt had appropriate neck soreness. No complaints of arm pain or new N/T/W. The patient remained afebrile with stable vital signs, and tolerated a regular diet. The patient continued to increase activities, and pain was well controlled with oral pain medications.   Consults: None  Significant Diagnostic Studies:  Results for orders placed or performed during the hospital encounter of 11/14/20  Type and screen Alyssa Hester  Result Value Ref Range   ABO/RH(D) A NEG    Antibody Screen NEG    Sample Expiration      11/17/2020,2359 Performed at [facility], [place] [address] [place], [place] [address]   ABO/Rh  Result Value Ref Range   ABO/RH(D)      A NEG Performed at [facility] [address] [place], [place] [address]   Surgical pathology  Result Value Ref Range   SURGICAL PATHOLOGY      SURGICAL PATHOLOGY CASE: [id number] PATIENT: Alyssa Hester Surgical Pathology Report     Clinical History: cervical neurofibroma (cm)   FINAL MICROSCOPIC DIAGNOSIS:  A. EPIDURAL MASS, LEFT SIDE CERVICAL FOUR, RESECTION: -  Schwanomma  B. EPIDURAL MASS, LEFT SIDE CERVICAL FOUR, RESECTION: -   Schwannoma   INTRAOPERATIVE DIAGNOSIS:  A.  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 Melina Copa at 12:37 PM on 11/14/2020.  GROSS DESCRIPTION:  A: The specimen is received fresh for rapid intraoperative consultation and consists of a 0.6 x 0.5 x 0.1 cm aggregate of tan-white soft tissue. The specimen is entirely submitted for frozen section analysis, and the remnant is resubmitted for permanent in 1 cassette.  B: The specimen is received fresh and consists of a 4.5 x 3.3 x 1.0 cm aggregate of tan-pink soft tissue.  Representative sections are submitted in 2 cassettes.  Craig Staggers 11/14/2020)   Fina l Diagnosis performed by Thressa Sheller, MD.   Electronically signed 11/15/2020 Technical and / or Professional components performed at [facility]. [facility], [place] [address], [place], [place] [address].  Immunohistochemistry Technical component (if applicable) was performed at [facility] [address] [place], [place], [place] [address].   IMMUNOHISTOCHEMISTRY DISCLAIMER (if applicable): Some of these immunohistochemical stains may have been developed and the performance characteristics determine by [facility]. Some may not have been cleared or approved by the U.S. Food and Drug Administration. The FDA has determined that such clearance or approval is not necessary. This test is used for clinical purposes. It should not be regarded as investigational or for research. This laboratory is certified under the [place] (CLIA-88) as qualified to perform high complexity clinical laboratory testing .  The controls stained appropriately.     DG Cervical Spine 2 or  3 views  Result Date: 11/14/2020 CLINICAL DATA:  ACDF EXAM: CERVICAL SPINE - 2-3 VIEW; DG C-ARM 1-60 MIN COMPARISON:  None. FINDINGS: Single cross-table lateral view of the cervical spine  obtained in the operating room. Surgical instruments localize posterior to C4 posterior elements. Fluoroscopy time 3 seconds. IMPRESSION: Intraoperative fluoroscopy during cervical spine surgery. Electronically Signed   By: Keith Rake M.D.   On: 11/14/2020 16:09   MR CERVICAL SPINE W WO CONTRAST  Result Date: 11/15/2020 CLINICAL DATA:  Brain/CNS neoplasm, staging. EXAM: MRI CERVICAL SPINE WITHOUT AND WITH CONTRAST TECHNIQUE: Multiplanar and multiecho pulse sequences of the cervical spine, to include the craniocervical junction and cervicothoracic junction, were obtained without and with intravenous contrast. CONTRAST:  8.43mL GADAVIST GADOBUTROL 1 MMOL/ML IV SOLN COMPARISON:  MRI of the cervical spine July 05, 2020. FINDINGS: Alignment: Straightening of the cervical curvature. Vertebrae: Postsurgical changes from posterior cervical laminectomy, facetectomy and left foraminotomy at C3-4 and C4-5 with posterior fixation from C3 through C5. A T2 hypointense collection with peripheral irregular contrast enhancement is seen at the surgical cavity centered at the left at C3-4 neural foramen, likely representing blood products/posttreatment changes. No intradural focus of abnormal contrast enhancement. Cord: Normal signal and morphology. Posterior Fossa, vertebral arteries, paraspinal tissues: Normal flow void in the vertebral arteries. Postsurgical changes in the paravertebral soft tissues, otherwise unremarkable. Disc levels: C2-3: No spinal canal or neural foraminal stenosis. C3-4: Postsurgical changes, as described above. No spinal canal or neural foraminal stenosis. C4-5: Postsurgical changes as described above. No spinal canal or right neural foraminal stenosis. C5-6: Postsurgical changes, as described above. No spinal canal or neural foraminal stenosis. C6-7: Right posterolateral disc protrusion causing indentation on the thecal sac without significant spinal canal or neural foraminal stenosis. C7-T1: No  spinal canal or neural foraminal stenosis. IMPRESSION: Postsurgical changes from posterior cervical laminectomy, facetectomy and left foraminotomy at C3-4 and C4-5 with posterior fixation from C3 through C5. A T2 hypointense fluid collection with peripheral irregular contrast enhancement is seen at the surgical cavity centered at the left at C3-4 neural foramen, likely representing blood products/posttreatment changes. Follow-up MRI without and with contrast is suggested to evaluate for possible residual tumor after collection resolution. Electronically Signed   By: Pedro Earls M.D.   On: 11/15/2020 17:26   DG C-Arm 1-60 Min  Result Date: 11/14/2020 CLINICAL DATA:  ACDF EXAM: CERVICAL SPINE - 2-3 VIEW; DG C-ARM 1-60 MIN COMPARISON:  None. FINDINGS: Single cross-table lateral view of the cervical spine obtained in the operating room. Surgical instruments localize posterior to C4 posterior elements. Fluoroscopy time 3 seconds. IMPRESSION: Intraoperative fluoroscopy during cervical spine surgery. Electronically Signed   By: Keith Rake M.D.   On: 11/14/2020 16:09   ECHOCARDIOGRAM COMPLETE  Result Date: 10/21/2020    ECHOCARDIOGRAM REPORT   Patient Name:   SUA SPADAFORA Date of Exam: 10/21/2020 Medical Rec #:  [id number]      Height:       63.0 in Accession #:    [id number]     Weight:       186.1 lb Date of Birth:  06/27/1970      BSA:          1.875 m Patient Age:    76 years       BP:           129/86 mmHg Patient Gender: F              HR:  92 bpm. Exam Location:  [facility] Procedure: 2D Echo, Cardiac Doppler and Limited Color Doppler Indications:    Sinus tachycardia  History:        Patient has no prior history of Echocardiogram examinations.                 Cervical nerve acute neurofibroma with cervical spine                 compression.  Sonographer:    Maudry Mayhew MHA, RDMS, RVT, RDCS Referring Phys: [id number] Hilton [facility]  Sonographer Comments: Image acquisition  challenging due to patient body habitus and Image acquisition challenging due to respiratory motion. IMPRESSIONS  1. Left ventricular ejection fraction, by estimation, is 55 to 60%. The left ventricle has normal function. Left ventricular endocardial border not optimally defined to evaluate regional wall motion. Left ventricular diastolic parameters were normal.  2. Right ventricular systolic function is normal. The right ventricular size is normal. There is normal pulmonary artery systolic pressure.  3. The mitral valve is normal in structure. No evidence of mitral valve regurgitation. No evidence of mitral stenosis.  4. The aortic valve was not well visualized. Aortic valve regurgitation is not visualized. No aortic stenosis is present.  5. The inferior vena cava is normal in size with greater than 50% respiratory variability, suggesting right atrial pressure of 3 mmHg. FINDINGS  Left Ventricle: Left ventricular ejection fraction, by estimation, is 55 to 60%. The left ventricle has normal function. Left ventricular endocardial border not optimally defined to evaluate regional wall motion. The left ventricular internal cavity size was normal in size. There is no left ventricular hypertrophy. Left ventricular diastolic parameters were normal. Normal left ventricular filling pressure. Right Ventricle: The right ventricular size is normal. No increase in right ventricular wall thickness. Right ventricular systolic function is normal. There is normal pulmonary artery systolic pressure. The tricuspid regurgitant velocity is 1.03 m/s, and  with an assumed right atrial pressure of 3 mmHg, the estimated right ventricular systolic pressure is 7.2 mmHg. Left Atrium: Left atrial size was normal in size. Right Atrium: Right atrial size was normal in size. Pericardium: There is no evidence of pericardial effusion. Mitral Valve: The mitral valve is normal in structure. No evidence of mitral valve regurgitation. No evidence of  mitral valve stenosis. Tricuspid Valve: The tricuspid valve is normal in structure. Tricuspid valve regurgitation is trivial. No evidence of tricuspid stenosis. Aortic Valve: The aortic valve was not well visualized. Aortic valve regurgitation is not visualized. No aortic stenosis is present. Aortic valve mean gradient measures 3.0 mmHg. Aortic valve peak gradient measures 6.6 mmHg. Aortic valve area, by VTI measures 2.30 cm. Pulmonic Valve: The pulmonic valve was not well visualized. Pulmonic valve regurgitation is not visualized. No evidence of pulmonic stenosis. Aorta: The aortic root is normal in size and structure and the aortic arch was not well visualized. Venous: The pulmonary veins were not well visualized. The inferior vena cava is normal in size with greater than 50% respiratory variability, suggesting right atrial pressure of 3 mmHg. IAS/Shunts: No atrial level shunt detected by color flow Doppler.  LEFT VENTRICLE PLAX 2D LVIDd:         3.43 cm     Diastology LVIDs:         2.53 cm     LV e' medial:    9.79 cm/s LV PW:         0.74 cm     LV E/e' medial:  8.2 LV IVS:        0.76 cm     LV e' lateral:   15.20 cm/s LVOT diam:     1.90 cm     LV E/e' lateral: 5.3 LV SV:         44 LV SV Index:   24 LVOT Area:     2.84 cm  LV Volumes (MOD) LV vol d, MOD A2C: 27.9 ml LV vol d, MOD A4C: 36.9 ml LV vol s, MOD A2C: 7.9 ml LV vol s, MOD A4C: 11.7 ml LV SV MOD A2C:     20.0 ml LV SV MOD A4C:     36.9 ml LV SV MOD BP:      21.8 ml LEFT ATRIUM           Index       RIGHT ATRIUM          Index LA diam:      2.20 cm 1.17 cm/m  RA Area:     9.19 cm LA Vol (A4C): 20.7 ml 11.04 ml/m RA Volume:   17.60 ml 9.39 ml/m  AORTIC VALVE AV Area (Vmax):    2.05 cm AV Area (Vmean):   1.76 cm AV Area (VTI):     2.30 cm AV Vmax:           128.00 cm/s AV Vmean:          88.300 cm/s AV VTI:            0.192 m AV Peak Grad:      6.6 mmHg AV Mean Grad:      3.0 mmHg LVOT Vmax:         92.40 cm/s LVOT Vmean:        54.900 cm/s  LVOT VTI:          0.156 m LVOT/AV VTI ratio: 0.81  AORTA Ao Root diam: 2.50 cm MITRAL VALVE               TRICUSPID VALVE MV Area (PHT): 2.74 cm    TR Peak grad:   4.2 mmHg MV Decel Time: 277 msec    TR Vmax:        103.00 cm/s MV E velocity: 80.40 cm/s MV A velocity: 69.20 cm/s  SHUNTS MV E/A ratio:  1.16        Systemic VTI:  0.16 m                            Systemic Diam: 1.90 cm Shirlee More MD Electronically signed by Shirlee More MD Signature Date/Time: 10/21/2020/5:23:38 PM    Final     Antibiotics:  Anti-infectives (From admission, onward)   Start     Dose/Rate Route Frequency Ordered Stop   11/14/20 1745  ceFAZolin (ANCEF) IVPB 2g/100 mL premix  Status:  Discontinued        2 g 200 mL/hr over 30 Minutes Intravenous Every 8 hours 11/14/20 1735 11/15/20 2239   11/14/20 0845  ceFAZolin (ANCEF) IVPB 2g/100 mL premix        2 g 200 mL/hr over 30 Minutes Intravenous On call to O.R. 11/14/20 0831 11/14/20 1604      Discharge Exam: Blood pressure 123/84, pulse 99, temperature 98.4 F (36.9 C), temperature source Oral, resp. rate 17, height 5\' 3"  (1.6 m), weight 83.5 kg, SpO2 99 %. Neurologic: Grossly normal Ambulating and voiding well, incision cdi   Discharge Medications:  Allergies as of 11/16/2020      Reactions   Codeine Nausea Only, Nausea And Vomiting   Sulfa Antibiotics Nausea And Vomiting   Latex Rash   Welps      Medication List    TAKE these medications   acetaminophen 500 MG tablet Commonly known as: TYLENOL Take 1,000 mg by mouth every 6 (six) hours as needed for moderate pain or headache (Arthritis).   estradiol 1 MG tablet Commonly known as: ESTRACE Take 1 mg by mouth at bedtime.   HYDROcodone-acetaminophen 5-325 MG tablet Commonly known as: NORCO/VICODIN Take 1 tablet by mouth 2 (two) times daily as needed for severe pain. What changed: Another medication with the same name was added. Make sure you understand how and when to take each.    HYDROcodone-acetaminophen 5-325 MG tablet Commonly known as: NORCO/VICODIN Take 1 tablet by mouth every 4 (four) hours as needed for moderate pain. What changed: You were already taking a medication with the same name, and this prescription was added. Make sure you understand how and when to take each.   methocarbamol 500 MG tablet Commonly known as: Robaxin Take 1 tablet (500 mg total) by mouth 4 (four) times daily.   Mirena (52 MG) 20 MCG/DAY Iud Generic drug: levonorgestrel 1 each by Intrauterine route once.   traMADol 50 MG tablet Commonly known as: ULTRAM Take 50 mg by mouth daily.   Vitamin D-3 125 MCG (5000 UT) Tabs Take 5,000 Units by mouth daily.       Disposition: home   Final Dx: Posterior cervical laminectomy facetectomy and foraminotomies C3-4 C4-5 with complete facetectomy at C4 on the left and resection of extradural mass.  Discharge Instructions     Remove dressing in 72 hours   Complete by: As directed    Call MD for:  difficulty breathing, headache or visual disturbances   Complete by: As directed    Call MD for:  hives   Complete by: As directed    Call MD for:  persistant nausea and vomiting   Complete by: As directed    Call MD for:  redness, tenderness, or signs of infection (pain, swelling, redness, odor or green/yellow discharge around incision site)   Complete by: As directed    Call MD for:  severe uncontrolled pain   Complete by: As directed    Call MD for:  temperature >100.4   Complete by: As directed    Diet - low sodium heart healthy   Complete by: As directed    Driving Restrictions   Complete by: As directed    No driving for 2 weeks, no riding in the car for 1 week   Increase activity slowly   Complete by: As directed    Lifting restrictions   Complete by: As directed    No lifting more than 8 lbs         Signed: Ocie Cornfield Montrelle Eddings 11/16/2020, 12:01 PM

## 2020-12-29 DIAGNOSIS — D3611 Benign neoplasm of peripheral nerves and autonomic nervous system of face, head, and neck: Secondary | ICD-10-CM | POA: Diagnosis not present

## 2021-01-11 DIAGNOSIS — M25512 Pain in left shoulder: Secondary | ICD-10-CM | POA: Diagnosis not present

## 2021-01-11 DIAGNOSIS — M19012 Primary osteoarthritis, left shoulder: Secondary | ICD-10-CM | POA: Diagnosis not present

## 2021-01-19 DIAGNOSIS — M25552 Pain in left hip: Secondary | ICD-10-CM | POA: Diagnosis not present

## 2021-02-10 DIAGNOSIS — M25512 Pain in left shoulder: Secondary | ICD-10-CM | POA: Diagnosis not present

## 2021-03-15 DIAGNOSIS — M25512 Pain in left shoulder: Secondary | ICD-10-CM | POA: Diagnosis not present

## 2021-03-24 DIAGNOSIS — M25552 Pain in left hip: Secondary | ICD-10-CM | POA: Diagnosis not present

## 2021-03-24 DIAGNOSIS — M25562 Pain in left knee: Secondary | ICD-10-CM | POA: Diagnosis not present

## 2021-03-30 DIAGNOSIS — D3611 Benign neoplasm of peripheral nerves and autonomic nervous system of face, head, and neck: Secondary | ICD-10-CM | POA: Diagnosis not present

## 2021-04-04 DIAGNOSIS — M25552 Pain in left hip: Secondary | ICD-10-CM | POA: Diagnosis not present

## 2021-04-11 ENCOUNTER — Other Ambulatory Visit: Payer: Self-pay | Admitting: Obstetrics & Gynecology

## 2021-04-11 DIAGNOSIS — M25552 Pain in left hip: Secondary | ICD-10-CM | POA: Diagnosis not present

## 2021-04-11 DIAGNOSIS — Z1231 Encounter for screening mammogram for malignant neoplasm of breast: Secondary | ICD-10-CM

## 2021-04-24 DIAGNOSIS — M1612 Unilateral primary osteoarthritis, left hip: Secondary | ICD-10-CM | POA: Diagnosis not present

## 2021-05-02 DIAGNOSIS — D3611 Benign neoplasm of peripheral nerves and autonomic nervous system of face, head, and neck: Secondary | ICD-10-CM | POA: Diagnosis not present

## 2021-05-02 DIAGNOSIS — M50223 Other cervical disc displacement at C6-C7 level: Secondary | ICD-10-CM | POA: Diagnosis not present

## 2021-05-16 ENCOUNTER — Ambulatory Visit
Admission: RE | Admit: 2021-05-16 | Discharge: 2021-05-16 | Disposition: A | Discharge status: Home / Self Care | Payer: BC Managed Care – PPO | Source: Ambulatory Visit

## 2021-05-16 DIAGNOSIS — R030 Elevated blood-pressure reading, without diagnosis of hypertension: Secondary | ICD-10-CM | POA: Diagnosis not present

## 2021-05-16 DIAGNOSIS — D3611 Benign neoplasm of peripheral nerves and autonomic nervous system of face, head, and neck: Secondary | ICD-10-CM | POA: Diagnosis not present

## 2021-05-16 DIAGNOSIS — Z1231 Encounter for screening mammogram for malignant neoplasm of breast: Secondary | ICD-10-CM | POA: Diagnosis not present

## 2021-05-16 DIAGNOSIS — Z6835 Body mass index (BMI) 35.0-35.9, adult: Secondary | ICD-10-CM | POA: Diagnosis not present

## 2021-05-18 DIAGNOSIS — Z01818 Encounter for other preprocedural examination: Secondary | ICD-10-CM | POA: Diagnosis not present

## 2021-05-22 DIAGNOSIS — Z01818 Encounter for other preprocedural examination: Secondary | ICD-10-CM | POA: Diagnosis not present

## 2021-05-29 DIAGNOSIS — M1612 Unilateral primary osteoarthritis, left hip: Secondary | ICD-10-CM | POA: Diagnosis not present

## 2022-07-12 IMAGING — CT CT RENAL STONE PROTOCOL
2 of 4 series · 17 of 46 positions shown, 19 images · non-contrast
Comparison: CT 10/10/2020

CLINICAL DATA: Generalized abdominal pain

EXAM:
CT ABDOMEN AND PELVIS WITHOUT CONTRAST
TECHNIQUE: Multidetector CT imaging of the abdomen and pelvis was performed
following the standard protocol without IV contrast.

[Series 2: axial st · axial · 0.78mm/px · z∈[-438,-28]mm · 14 of 90 slices shown, 16 images]
[im 4/90  soft-tissue]
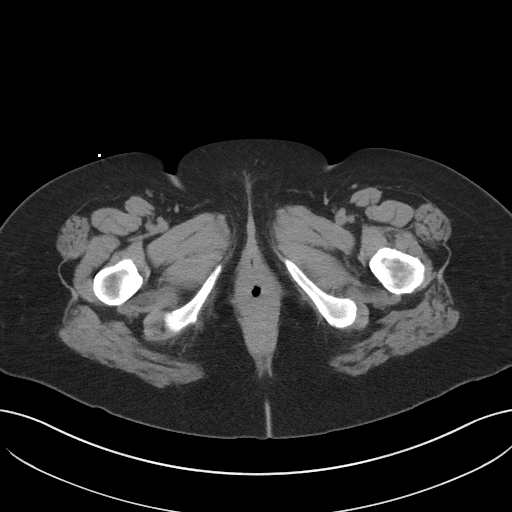
[im 4/90  bone]
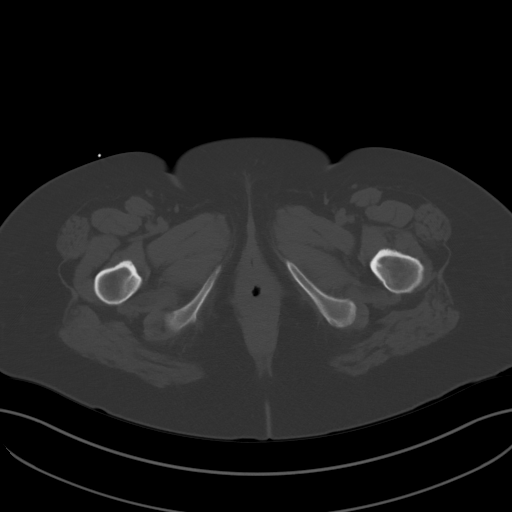
[im 12/90  soft-tissue]
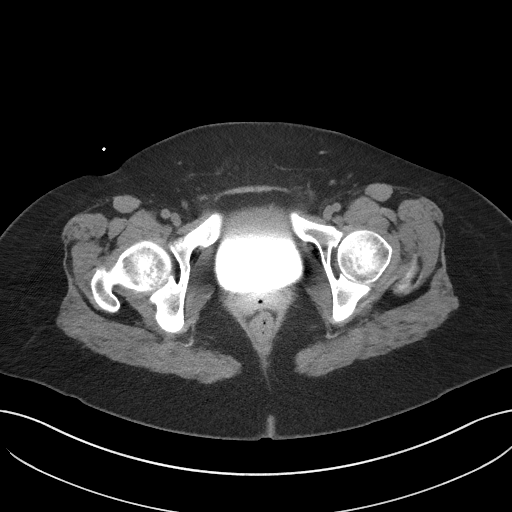
[im 16/90  soft-tissue]
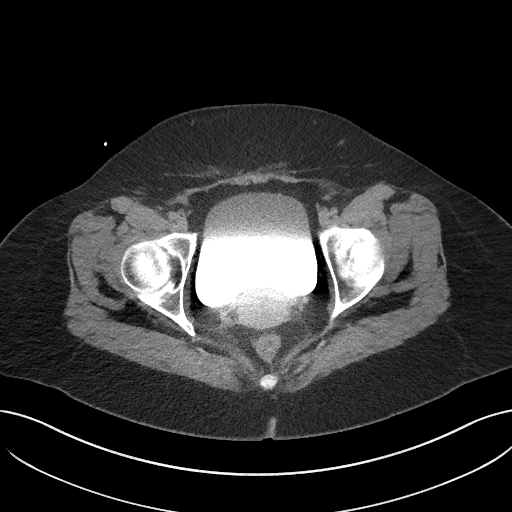
[im 24/90  soft-tissue]
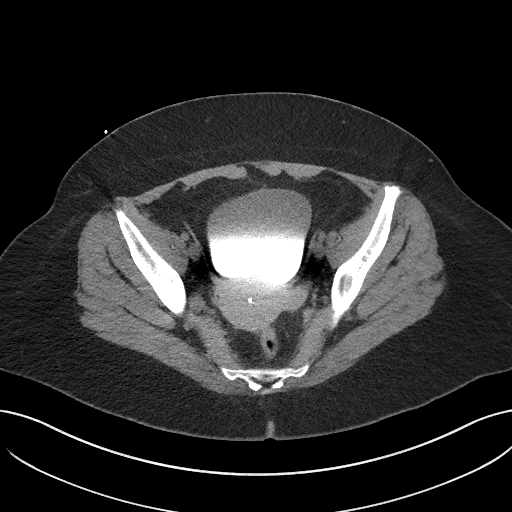
[im 31/90  soft-tissue]
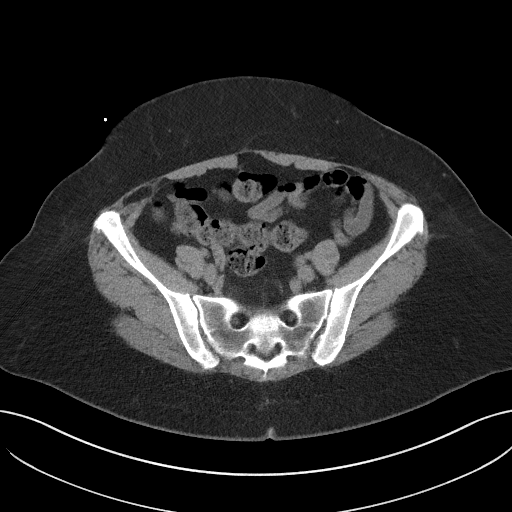
[im 35/90  soft-tissue]
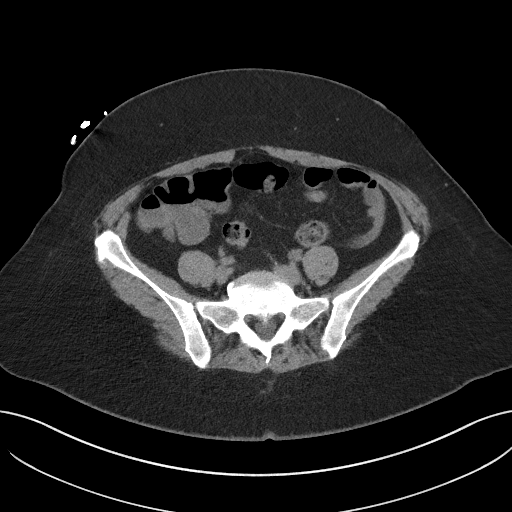
[im 43/90  soft-tissue]
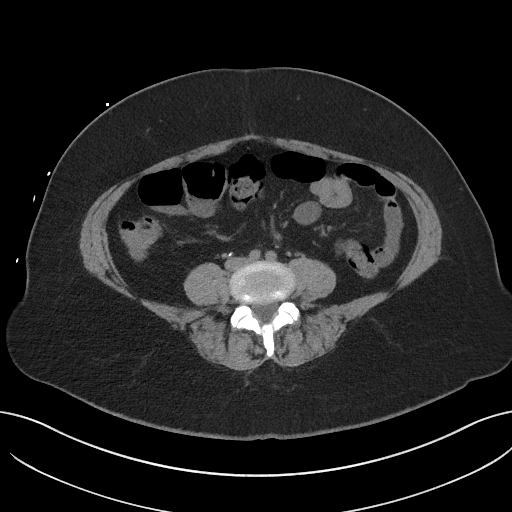
[im 47/90  soft-tissue]
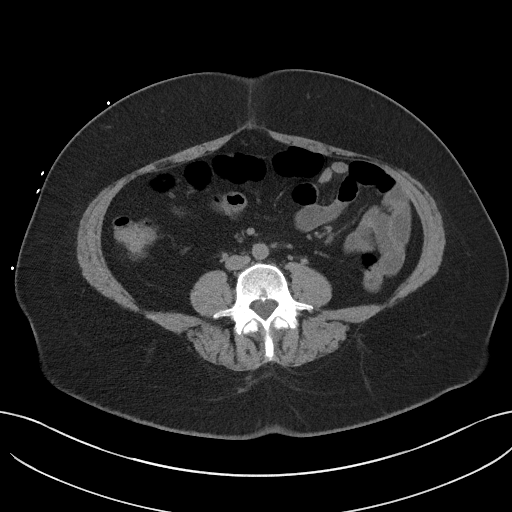
[im 55/90  soft-tissue]
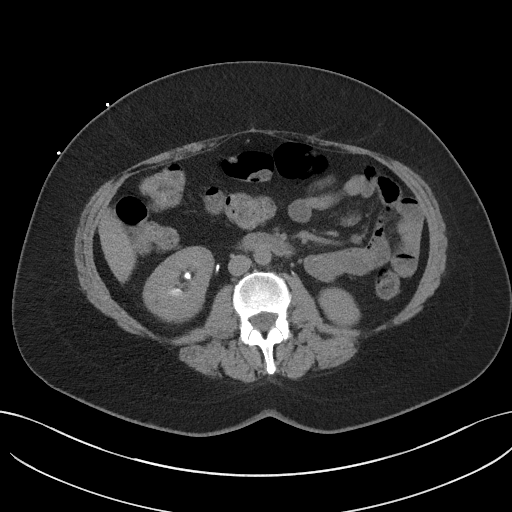
[im 55/90  bone]
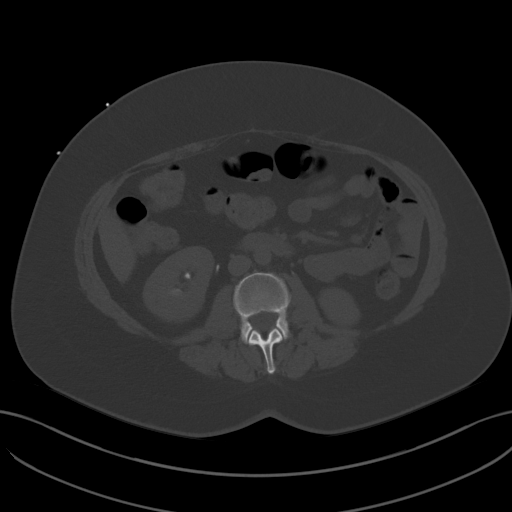
[im 59/90  soft-tissue]
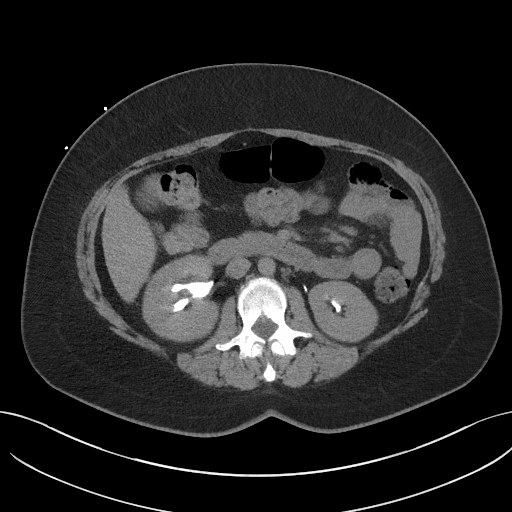
[im 66/90  soft-tissue]
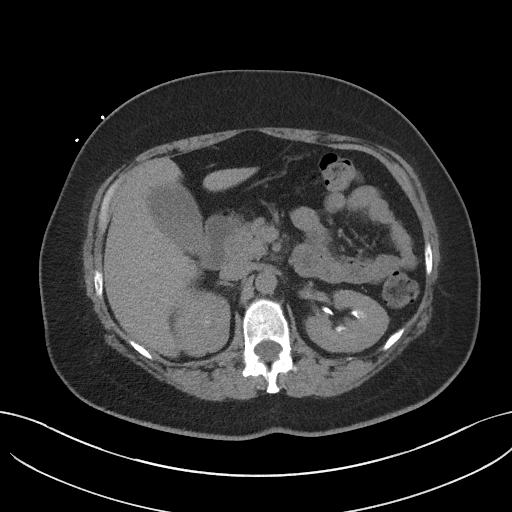
[im 74/90  soft-tissue]
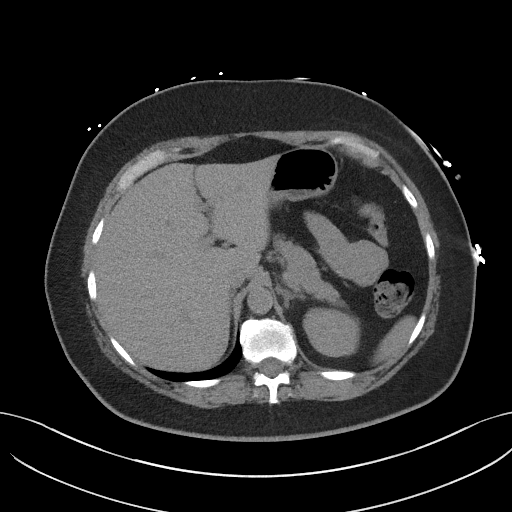
[im 78/90  soft-tissue]
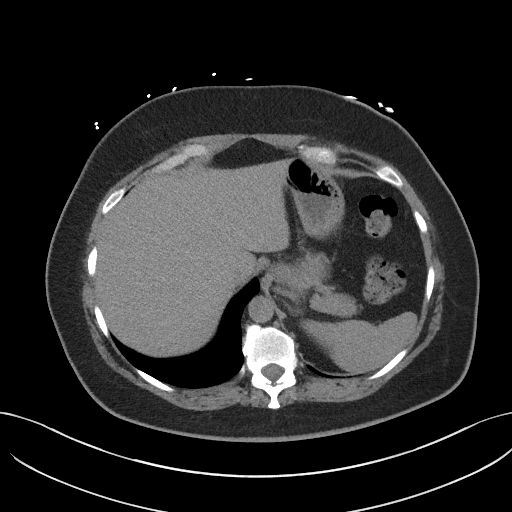
[im 86/90  soft-tissue]
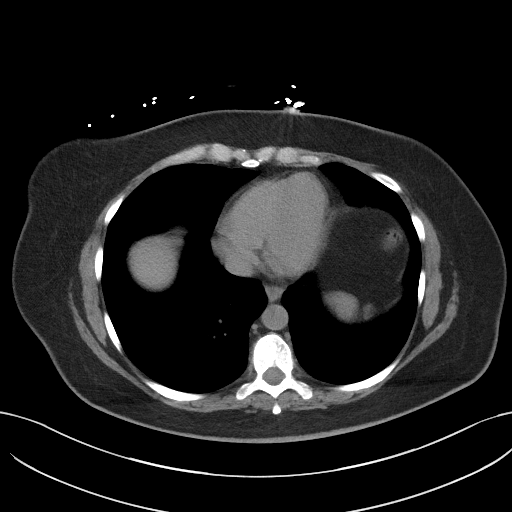

[Series 5: coronal st · coronal · 0.92mm/px · 3 of 106 slices shown]
[im 36/106  soft-tissue]
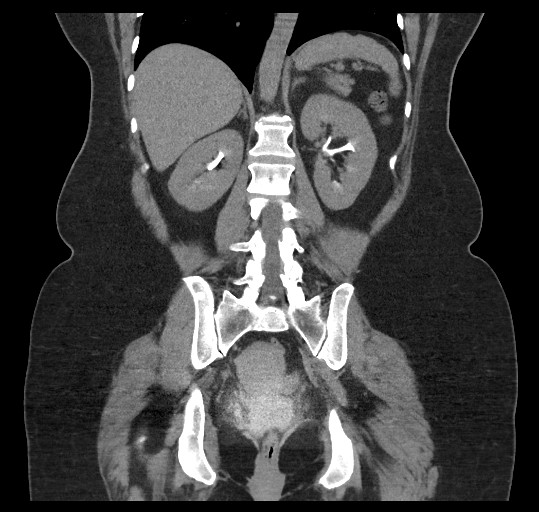
[im 47/106  soft-tissue]
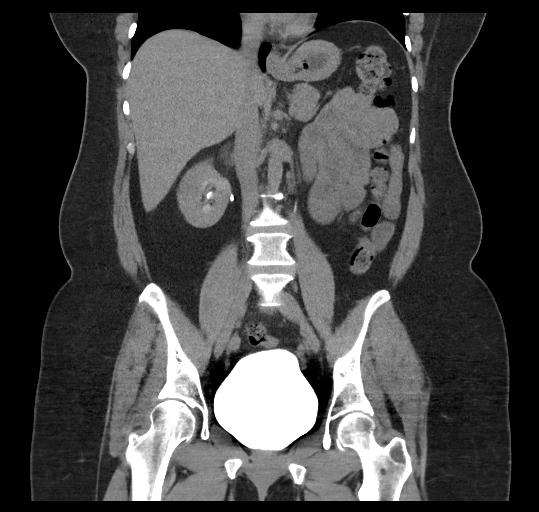
[im 59/106  soft-tissue]
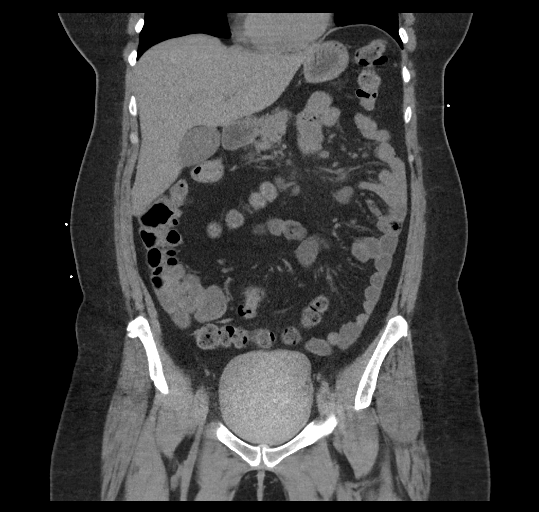

[17 of 46 positions shown; findings below may reference images not displayed]

FINDINGS: Lower chest: Lung bases are clear.

Hepatobiliary: No focal liver abnormality is seen. No gallstones,
gallbladder wall thickening, or biliary dilatation.

Pancreas: Unremarkable. No pancreatic ductal dilatation or
surrounding inflammatory changes.

Spleen: Normal in size without focal abnormality.

Adrenals/Urinary Tract: Adrenal glands are normal. Excreted contrast
in the renal collecting systems and bladder.

Stomach/Bowel: Stomach is within normal limits. History of prior
appendectomy. No evidence of bowel wall thickening, distention, or
inflammatory changes.

Vascular/Lymphatic: No significant vascular findings are present. No
enlarged abdominal or pelvic lymph nodes.

Reproductive: IUD in the uterus.  No adnexal mass.

Other: No abdominal wall hernia or abnormality. No abdominopelvic
ascites.

Musculoskeletal: No acute or significant osseous findings.
IMPRESSION: No CT evidence for acute intra-abdominal or pelvic abnormality.

## 2022-08-24 ENCOUNTER — Other Ambulatory Visit: Payer: Self-pay | Admitting: Obstetrics & Gynecology

## 2022-08-24 DIAGNOSIS — Z1231 Encounter for screening mammogram for malignant neoplasm of breast: Secondary | ICD-10-CM

## 2022-10-09 ENCOUNTER — Ambulatory Visit (INDEPENDENT_AMBULATORY_CARE_PROVIDER_SITE_OTHER): Payer: Managed Care, Other (non HMO) | Admitting: Podiatry

## 2022-10-09 ENCOUNTER — Ambulatory Visit: Payer: Managed Care, Other (non HMO)

## 2022-10-09 ENCOUNTER — Ambulatory Visit (INDEPENDENT_AMBULATORY_CARE_PROVIDER_SITE_OTHER): Payer: Managed Care, Other (non HMO)

## 2022-10-09 ENCOUNTER — Ambulatory Visit
Admission: RE | Admit: 2022-10-09 | Discharge: 2022-10-09 | Disposition: A | Discharge status: Home / Self Care | Payer: Managed Care, Other (non HMO) | Source: Ambulatory Visit | Attending: Obstetrics & Gynecology | Admitting: Obstetrics & Gynecology

## 2022-10-09 DIAGNOSIS — Z1231 Encounter for screening mammogram for malignant neoplasm of breast: Secondary | ICD-10-CM

## 2022-10-09 DIAGNOSIS — M7732 Calcaneal spur, left foot: Secondary | ICD-10-CM | POA: Diagnosis not present

## 2022-10-09 DIAGNOSIS — M7662 Achilles tendinitis, left leg: Secondary | ICD-10-CM

## 2022-10-09 DIAGNOSIS — M722 Plantar fascial fibromatosis: Secondary | ICD-10-CM | POA: Diagnosis not present

## 2022-10-09 MED ORDER — NITROGLYCERIN 0.2 MG/HR TD PT24: 0.2000 mg | MEDICATED_PATCH | Freq: Every day | TRANSDERMAL | 0 refills | Status: DC

## 2022-10-09 NOTE — Progress Notes (Signed)
Subjective:   Patient ID: Alyssa Hester, female   DOB: 52 y.o.   MRN: [id number]   HPI  Chief Complaint  Patient presents with   Foot Pain    Rm 11  Left foot pain x 2 months. Pain located at the back of heel. Constant aching pains w/o edema, tingling or numbness. Worse pain at first steps after rest. Pt has tried a walking boot and exercises. Pt is taking Celebrex for other reason and is unable to use ibuprofen.    52 year old female presents the office for above concerns.  She states that she been in pain for last couple months to the back of her left heel.  She says it hurts that she has been sitting down or sleeping she stands back up for the first couple steps or if she has been sitting too long with pressure on the heel.  No injuries.  She has been on Celebrex for the last year for other issues so she has not seen much of an improvement with this.  Review of Systems  All other systems reviewed and are negative.   Past Medical History:  Diagnosis Date   Arthritis    Bilateral hips and back   Asthma    Chronic pain    PONV (postoperative nausea and vomiting)     Past Surgical History:  Procedure Laterality Date   APPENDECTOMY     CESAREAN SECTION     POSTERIOR CERVICAL FUSION/FORAMINOTOMY N/A 11/14/2020   Procedure: Posterior  Cervical Fusion Foraminotomy- Cervical three-four- - Cervical four-cervical five for resection of tumor with lateral mass fixation Cervical three-Cervical five;  Surgeon: Donalee Citrin, MD;  Location: [facility] OR;  Service: Neurosurgery;  Laterality: N/A;   WISDOM TOOTH EXTRACTION  1993     Current Outpatient Medications:    acetaminophen (TYLENOL) 500 MG tablet, Take 1,000 mg by mouth every 6 (six) hours as needed for moderate pain or headache (Arthritis)., Disp: , Rfl:    Cholecalciferol (VITAMIN D-3) 125 MCG (5000 UT) TABS, Take 5,000 Units by mouth daily., Disp: , Rfl:    estradiol (ESTRACE) 1 MG tablet, Take 1 mg by mouth at bedtime., Disp: , Rfl:     HYDROcodone-acetaminophen (NORCO/VICODIN) 5-325 MG tablet, Take 1 tablet by mouth 2 (two) times daily as needed for severe pain., Disp: , Rfl:    levonorgestrel (MIRENA, 52 MG,) 20 MCG/DAY IUD, 1 each by Intrauterine route once., Disp: , Rfl:    methocarbamol (ROBAXIN) 500 MG tablet, Take 1 tablet (500 mg total) by mouth 4 (four) times daily., Disp: 45 tablet, Rfl: 0   traMADol (ULTRAM) 50 MG tablet, Take 50 mg by mouth daily., Disp: , Rfl:   Allergies  Allergen Reactions   Codeine Nausea Only and Nausea And Vomiting   Sulfa Antibiotics Nausea And Vomiting   Latex Rash    Welps          Objective:  Physical Exam  General: AAO x3, NAD  Dermatological: Skin is warm, dry and supple bilateral.  There are no open sores, no preulcerative lesions, no rash or signs of infection present.  Vascular: Dorsalis Pedis artery and Posterior Tibial artery pedal pulses are 2/4 bilateral with immedate capillary fill time. Pedal hair growth present. There is no pain with calf compression, swelling, warmth, erythema.   Neruologic: Grossly intact via light touch bilateral.  Negative Tinel sign.  Musculoskeletal: There is palpation of the posterior aspect calcaneus on the area of a prominent heel spur.  Mild tenderness  to the distal portion Achilles tendon.  Achilles tendon appears to be intact.  Minimal discomfort at the plantar aspect of the heel insertion of plantar fascia.  No pain with compression of the heel.  No deficit noted.  MMT 5/5.  Gait: Unassisted, Nonantalgic.       Assessment:   Left posterior heel spur, Achilles tendonitis       Plan:  -Treatment options discussed including all alternatives, risks, and complications -Etiology of symptoms were discussed -Follow-up x-rays obtained reviewed.  Multiple views were obtained.  Posterior, inferior fat spur is present.  No evidence of acute fracture. -She is already on celecoxib and other anti-inflammatories for now. -Nitropatch to the  heel.  Discussed side effects as well as this is an off-label use.  -Discussed stretching, icing daily. -Heel lift short-term -Achilles gel sleeve to offload the heel spur. -Shoes, good arch support. -She has a night splint and recommended to continue with this.   Vivi Barrack DPM

## 2022-10-09 NOTE — Patient Instructions (Signed)

## 2022-10-14 ENCOUNTER — Encounter: Payer: Self-pay | Admitting: Podiatry

## 2022-11-13 ENCOUNTER — Other Ambulatory Visit: Payer: Self-pay | Admitting: Podiatry

## 2022-11-13 ENCOUNTER — Encounter: Payer: Self-pay | Admitting: Podiatry

## 2022-11-13 MED ORDER — NITROGLYCERIN 0.2 MG/HR TD PT24: 0.2000 mg | MEDICATED_PATCH | Freq: Every day | TRANSDERMAL | 0 refills | Status: AC

## 2023-01-23 LAB — EXTERNAL GENERIC LAB PROCEDURE: COLOGUARD: NEGATIVE

## 2023-03-20 ENCOUNTER — Other Ambulatory Visit (HOSPITAL_BASED_OUTPATIENT_CLINIC_OR_DEPARTMENT_OTHER): Payer: Self-pay

## 2023-09-10 ENCOUNTER — Other Ambulatory Visit: Payer: Self-pay | Admitting: Obstetrics & Gynecology

## 2023-09-10 DIAGNOSIS — Z0000 Encounter for general adult medical examination without abnormal findings: Secondary | ICD-10-CM

## 2023-10-10 ENCOUNTER — Ambulatory Visit
Admission: RE | Admit: 2023-10-10 | Discharge: 2023-10-10 | Disposition: A | Discharge status: Home / Self Care | Source: Ambulatory Visit | Attending: Obstetrics & Gynecology | Admitting: Obstetrics & Gynecology

## 2023-10-10 DIAGNOSIS — Z0000 Encounter for general adult medical examination without abnormal findings: Secondary | ICD-10-CM

## 2024-07-17 ENCOUNTER — Other Ambulatory Visit (HOSPITAL_COMMUNITY): Payer: Self-pay
